# Patient Record
Sex: Male | Born: 1970
Health system: Southern US, Community
[De-identification: ages and names within clinical notes are randomized; demographics above are authoritative.]

## PROBLEM LIST (undated history)

## (undated) DIAGNOSIS — H9190 Unspecified hearing loss, unspecified ear: Secondary | ICD-10-CM

## (undated) DIAGNOSIS — E119 Type 2 diabetes mellitus without complications: Secondary | ICD-10-CM

## (undated) HISTORY — DX: Type 2 diabetes mellitus without complications: E11.9

## (undated) HISTORY — DX: Unspecified hearing loss, unspecified ear: H91.90

## (undated) HISTORY — PX: NO PAST SURGERIES: SHX2092

---

## 2018-12-24 ENCOUNTER — Emergency Department (HOSPITAL_COMMUNITY)
Admission: EM | Admit: 2018-12-24 | Discharge: 2018-12-24 | Disposition: A | Discharge status: Home / Self Care | Payer: No Typology Code available for payment source | Attending: Emergency Medicine | Admitting: Emergency Medicine

## 2018-12-24 ENCOUNTER — Emergency Department (HOSPITAL_COMMUNITY): Payer: No Typology Code available for payment source

## 2018-12-24 ENCOUNTER — Encounter (HOSPITAL_COMMUNITY): Payer: Self-pay

## 2018-12-24 DIAGNOSIS — R519 Headache, unspecified: Secondary | ICD-10-CM

## 2018-12-24 DIAGNOSIS — M545 Low back pain, unspecified: Secondary | ICD-10-CM

## 2018-12-24 DIAGNOSIS — Y999 Unspecified external cause status: Secondary | ICD-10-CM | POA: Diagnosis not present

## 2018-12-24 DIAGNOSIS — M25562 Pain in left knee: Secondary | ICD-10-CM | POA: Insufficient documentation

## 2018-12-24 DIAGNOSIS — R51 Headache: Secondary | ICD-10-CM | POA: Diagnosis not present

## 2018-12-24 DIAGNOSIS — S139XXA Sprain of joints and ligaments of unspecified parts of neck, initial encounter: Secondary | ICD-10-CM | POA: Insufficient documentation

## 2018-12-24 DIAGNOSIS — Y9389 Activity, other specified: Secondary | ICD-10-CM | POA: Diagnosis not present

## 2018-12-24 DIAGNOSIS — S199XXA Unspecified injury of neck, initial encounter: Secondary | ICD-10-CM | POA: Diagnosis present

## 2018-12-24 DIAGNOSIS — Y92410 Unspecified street and highway as the place of occurrence of the external cause: Secondary | ICD-10-CM | POA: Diagnosis not present

## 2018-12-24 DIAGNOSIS — M79661 Pain in right lower leg: Secondary | ICD-10-CM | POA: Insufficient documentation

## 2018-12-24 MED ORDER — HYDROCODONE-ACETAMINOPHEN 5-325 MG PO TABS
1.0000 | ORAL_TABLET | Freq: Once | ORAL | Status: AC
  Administered 2018-12-24: 1 via ORAL
  Filled 2018-12-24: qty 1

## 2018-12-24 MED ORDER — METHOCARBAMOL 500 MG PO TABS: 500.0000 mg | ORAL_TABLET | Freq: Two times a day (BID) | ORAL | 0 refills | Status: DC

## 2018-12-24 MED ORDER — NAPROXEN 500 MG PO TABS: 500.0000 mg | ORAL_TABLET | Freq: Two times a day (BID) | ORAL | 0 refills | Status: DC
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
## 2018-12-24 NOTE — ED Notes (Signed)
WALLE at bedside. Call bell at bedside.

## 2018-12-24 NOTE — ED Notes (Signed)
In ct/xray

## 2019-01-16 ENCOUNTER — Inpatient Hospital Stay (HOSPITAL_COMMUNITY)
Admission: EM | Admit: 2019-01-16 | Discharge: 2019-01-24 | DRG: 177 | Disposition: A | Discharge status: Home / Self Care | Payer: HRSA Program | Attending: Family Medicine | Admitting: Family Medicine

## 2019-01-16 ENCOUNTER — Encounter (HOSPITAL_COMMUNITY): Payer: Self-pay | Admitting: Emergency Medicine

## 2019-01-16 ENCOUNTER — Emergency Department (HOSPITAL_COMMUNITY): Payer: HRSA Program

## 2019-01-16 ENCOUNTER — Other Ambulatory Visit: Payer: Self-pay

## 2019-01-16 DIAGNOSIS — Z79899 Other long term (current) drug therapy: Secondary | ICD-10-CM | POA: Diagnosis not present

## 2019-01-16 DIAGNOSIS — J9601 Acute respiratory failure with hypoxia: Secondary | ICD-10-CM | POA: Diagnosis present

## 2019-01-16 DIAGNOSIS — J1289 Other viral pneumonia: Secondary | ICD-10-CM | POA: Diagnosis present

## 2019-01-16 DIAGNOSIS — U071 COVID-19: Principal | ICD-10-CM | POA: Diagnosis present

## 2019-01-16 DIAGNOSIS — J069 Acute upper respiratory infection, unspecified: Secondary | ICD-10-CM

## 2019-01-16 DIAGNOSIS — Z6838 Body mass index (BMI) 38.0-38.9, adult: Secondary | ICD-10-CM | POA: Diagnosis not present

## 2019-01-16 DIAGNOSIS — I517 Cardiomegaly: Secondary | ICD-10-CM | POA: Diagnosis present

## 2019-01-16 DIAGNOSIS — Z791 Long term (current) use of non-steroidal anti-inflammatories (NSAID): Secondary | ICD-10-CM | POA: Diagnosis not present

## 2019-01-16 DIAGNOSIS — I959 Hypotension, unspecified: Secondary | ICD-10-CM | POA: Diagnosis not present

## 2019-01-16 DIAGNOSIS — R739 Hyperglycemia, unspecified: Secondary | ICD-10-CM | POA: Diagnosis not present

## 2019-01-16 DIAGNOSIS — R0902 Hypoxemia: Secondary | ICD-10-CM

## 2019-01-16 DIAGNOSIS — E119 Type 2 diabetes mellitus without complications: Secondary | ICD-10-CM

## 2019-01-16 DIAGNOSIS — E1165 Type 2 diabetes mellitus with hyperglycemia: Secondary | ICD-10-CM | POA: Diagnosis present

## 2019-01-16 DIAGNOSIS — T50905A Adverse effect of unspecified drugs, medicaments and biological substances, initial encounter: Secondary | ICD-10-CM | POA: Diagnosis not present

## 2019-01-16 LAB — CBC WITH DIFFERENTIAL/PLATELET
Abs Immature Granulocytes: 0.04 10*3/uL (ref 0.00–0.07)
Basophils Absolute: 0 10*3/uL (ref 0.0–0.1)
Basophils Relative: 0 %
Eosinophils Absolute: 0 10*3/uL (ref 0.0–0.5)
Eosinophils Relative: 0 %
HCT: 44.3 % (ref 39.0–52.0)
Hemoglobin: 15.4 g/dL (ref 13.0–17.0)
Immature Granulocytes: 1 %
Lymphocytes Relative: 10 %
Lymphs Abs: 0.8 10*3/uL (ref 0.7–4.0)
MCH: 32.1 pg (ref 26.0–34.0)
MCHC: 34.8 g/dL (ref 30.0–36.0)
MCV: 92.3 fL (ref 80.0–100.0)
Monocytes Absolute: 0.3 10*3/uL (ref 0.1–1.0)
Monocytes Relative: 4 %
Neutro Abs: 7.1 10*3/uL (ref 1.7–7.7)
Neutrophils Relative %: 85 %
Platelets: 406 10*3/uL — ABNORMAL HIGH (ref 150–400)
RBC: 4.8 MIL/uL (ref 4.22–5.81)
RDW: 11.8 % (ref 11.5–15.5)
WBC: 8.2 10*3/uL (ref 4.0–10.5)
nRBC: 0 % (ref 0.0–0.2)

## 2019-01-16 LAB — FERRITIN: Ferritin: 408 ng/mL — ABNORMAL HIGH (ref 24–336)

## 2019-01-16 LAB — COMPREHENSIVE METABOLIC PANEL
ALT: 43 U/L (ref 0–44)
AST: 32 U/L (ref 15–41)
Albumin: 3.2 g/dL — ABNORMAL LOW (ref 3.5–5.0)
Alkaline Phosphatase: 82 U/L (ref 38–126)
Anion gap: 13 (ref 5–15)
BUN: 10 mg/dL (ref 6–20)
CO2: 23 mmol/L (ref 22–32)
Calcium: 8.8 mg/dL — ABNORMAL LOW (ref 8.9–10.3)
Chloride: 102 mmol/L (ref 98–111)
Creatinine, Ser: 0.69 mg/dL (ref 0.61–1.24)
GFR calc Af Amer: >60 mL/min (ref >60)
GFR calc non Af Amer: >60 mL/min (ref >60)
Glucose, Bld: 175 mg/dL — ABNORMAL HIGH (ref 70–99)
Potassium: 3.6 mmol/L (ref 3.5–5.1)
Sodium: 138 mmol/L (ref 135–145)
Total Bilirubin: 0.7 mg/dL (ref 0.3–1.2)
Total Protein: 7.4 g/dL (ref 6.5–8.1)

## 2019-01-16 LAB — POCT I-STAT EG7
Bicarbonate: 24.7 mmol/L (ref 20.0–28.0)
Calcium, Ion: 1.04 mmol/L — ABNORMAL LOW (ref 1.15–1.40)
HCT: 45 % (ref 39.0–52.0)
Hemoglobin: 15.3 g/dL (ref 13.0–17.0)
O2 Saturation: 93 %
Potassium: 3.6 mmol/L (ref 3.5–5.1)
Sodium: 138 mmol/L (ref 135–145)
TCO2: 26 mmol/L (ref 22–32)
pCO2, Ven: 39.3 mmHg — ABNORMAL LOW (ref 44.0–60.0)
pH, Ven: 7.406 (ref 7.250–7.430)
pO2, Ven: 66 mmHg — ABNORMAL HIGH (ref 32.0–45.0)

## 2019-01-16 LAB — D-DIMER, QUANTITATIVE: D-Dimer, Quant: 0.42 ug/mL-FEU (ref 0.00–0.50)

## 2019-01-16 LAB — LACTATE DEHYDROGENASE: LDH: 287 U/L — ABNORMAL HIGH (ref 98–192)

## 2019-01-16 LAB — TRIGLYCERIDES: Triglycerides: 122 mg/dL (ref <150)

## 2019-01-16 LAB — BRAIN NATRIURETIC PEPTIDE: B Natriuretic Peptide: 20.6 pg/mL (ref 0.0–100.0)

## 2019-01-16 LAB — FIBRINOGEN: Fibrinogen: >800 mg/dL — ABNORMAL HIGH (ref 210–475)

## 2019-01-16 LAB — MRSA PCR SCREENING: MRSA by PCR: NEGATIVE

## 2019-01-16 LAB — SARS CORONAVIRUS 2 BY RT PCR (HOSPITAL ORDER, PERFORMED IN ~~LOC~~ HOSPITAL LAB): SARS Coronavirus 2: POSITIVE — AB

## 2019-01-16 LAB — LACTIC ACID, PLASMA: Lactic Acid, Venous: 1.8 mmol/L (ref 0.5–1.9)

## 2019-01-16 LAB — C-REACTIVE PROTEIN: CRP: 16.8 mg/dL — ABNORMAL HIGH (ref <1.0)

## 2019-01-16 LAB — PROCALCITONIN: Procalcitonin: <0.1 ng/mL

## 2019-01-16 MED ORDER — SODIUM CHLORIDE 0.9 % IV SOLN
200.0000 mg | Freq: Once | INTRAVENOUS | Status: AC
  Administered 2019-01-16: 200 mg via INTRAVENOUS
  Filled 2019-01-16: qty 40

## 2019-01-16 MED ORDER — SODIUM CHLORIDE 0.9 % IV SOLN
1.0000 g | Freq: Once | INTRAVENOUS | Status: AC
  Administered 2019-01-16: 1 g via INTRAVENOUS
  Filled 2019-01-16: qty 10

## 2019-01-16 MED ORDER — CHLORHEXIDINE GLUCONATE CLOTH 2 % EX PADS
6.0000 | MEDICATED_PAD | Freq: Every day | CUTANEOUS | Status: DC
  Administered 2019-01-17 – 2019-01-24 (×8): 6 via TOPICAL

## 2019-01-16 MED ORDER — ENOXAPARIN SODIUM 40 MG/0.4ML ~~LOC~~ SOLN: 40.0000 mg | SUBCUTANEOUS | Status: DC

## 2019-01-16 MED ORDER — ONDANSETRON HCL 4 MG/2ML IJ SOLN: 4.0000 mg | Freq: Four times a day (QID) | INTRAMUSCULAR | Status: DC | PRN

## 2019-01-16 MED ORDER — CHLORHEXIDINE GLUCONATE CLOTH 2 % EX PADS: 6.0000 | MEDICATED_PAD | Freq: Every day | CUTANEOUS | Status: DC

## 2019-01-16 MED ORDER — ENOXAPARIN SODIUM 60 MG/0.6ML ~~LOC~~ SOLN
0.5000 mg/kg | Freq: Two times a day (BID) | SUBCUTANEOUS | Status: DC
  Administered 2019-01-17 – 2019-01-22 (×11): 60 mg via SUBCUTANEOUS
  Filled 2019-01-16 (×11): qty 0.6

## 2019-01-16 MED ORDER — SODIUM CHLORIDE 0.9 % IV SOLN
100.0000 mg | INTRAVENOUS | Status: AC
  Administered 2019-01-17 – 2019-01-20 (×4): 100 mg via INTRAVENOUS
  Filled 2019-01-16 (×4): qty 20

## 2019-01-16 MED ORDER — METHYLPREDNISOLONE SODIUM SUCC 125 MG IJ SOLR
60.0000 mg | Freq: Four times a day (QID) | INTRAMUSCULAR | Status: DC
  Administered 2019-01-16 – 2019-01-17 (×4): 60 mg via INTRAVENOUS
  Filled 2019-01-16 (×4): qty 2

## 2019-01-16 MED ORDER — TOCILIZUMAB 400 MG/20ML IV SOLN
800.0000 mg | Freq: Once | INTRAVENOUS | Status: AC
  Administered 2019-01-16: 800 mg via INTRAVENOUS
  Filled 2019-01-16: qty 40

## 2019-01-16 MED ORDER — ACETAMINOPHEN 325 MG PO TABS: 650.0000 mg | ORAL_TABLET | Freq: Four times a day (QID) | ORAL | Status: DC | PRN

## 2019-01-16 MED ORDER — POLYETHYLENE GLYCOL 3350 17 G PO PACK: 17.0000 g | PACK | Freq: Every day | ORAL | Status: DC | PRN

## 2019-01-16 MED ORDER — AZITHROMYCIN 250 MG PO TABS
500.0000 mg | ORAL_TABLET | Freq: Once | ORAL | Status: AC
  Administered 2019-01-16: 500 mg via ORAL
  Filled 2019-01-16: qty 2

## 2019-01-16 MED ORDER — ONDANSETRON HCL 4 MG PO TABS: 4.0000 mg | ORAL_TABLET | Freq: Four times a day (QID) | ORAL | Status: DC | PRN

## 2019-01-16 NOTE — ED Provider Notes (Signed)
[facility] Provider Note   CSN: [id number] Arrival date & time: 01/16/19  1046    History   Chief Complaint Chief Complaint  Patient presents with  . Shortness of Breath    HPI Terry Cole is a 48 y.o. male.     48 year old male who presents with shortness of breath.  He reports 5 days of dry cough that has been improving but he has had progressively worsening shortness of breath.  He reports sore throat.  No fevers, body aches, vomiting, diarrhea, sick contacts, or recent travel.  No leg swelling.  He has tried cough medication for his symptoms.  The history is provided by the patient. A language interpreter was used.    History reviewed. No pertinent past medical history.  There are no active problems to display for this patient.   History reviewed. No pertinent surgical history.      Home Medications    Prior to Admission medications   Medication Sig Start Date End Date Taking? Authorizing Provider  methocarbamol (ROBAXIN) 500 MG tablet Take 1 tablet (500 mg total) by mouth 2 (two) times daily. 12/24/18   Henderly, Britni A, PA-C  naproxen (NAPROSYN) 500 MG tablet Take 1 tablet (500 mg total) by mouth 2 (two) times daily. 12/24/18   Henderly, Britni A, PA-C    Family History No family history on file.  Social History Social History   Tobacco Use  . Smoking status: Never Smoker  . Smokeless tobacco: Never Used  Substance Use Topics  . Alcohol use: Yes    Alcohol/week: 1.0 standard drinks    Types: 1 Cans of beer per week  . Drug use: Not on file     Allergies   Patient has no allergy information on record.   Review of Systems Review of Systems All other systems reviewed and are negative except that which was mentioned in HPI   Physical Exam Updated Vital Signs BP 117/79   Pulse 97   Temp 98.2 F (36.8 C) (Oral)   Resp (!) 35   SpO2 94%   Physical Exam Vitals signs and nursing note reviewed.   Constitutional:      Appearance: He is well-developed. He is ill-appearing and diaphoretic. He is not toxic-appearing.  HENT:     Head: Normocephalic and atraumatic.  Eyes:     Conjunctiva/sclera: Conjunctivae normal.  Neck:     Musculoskeletal: Neck supple.  Cardiovascular:     Rate and Rhythm: Regular rhythm. Tachycardia present.  Pulmonary:     Effort: Tachypnea and accessory muscle usage present.     Comments: Increased work of breathing with tachypnea, no severe respiratory distress Abdominal:     General: Bowel sounds are normal. There is no distension.     Palpations: Abdomen is soft.     Tenderness: There is no abdominal tenderness.  Musculoskeletal:     Right lower leg: No edema.     Left lower leg: No edema.  Skin:    General: Skin is warm.  Neurological:     Mental Status: He is alert and oriented to person, place, and time.     Comments: Fluent speech  Psychiatric:        Mood and Affect: Mood is anxious.        Judgment: Judgment normal.      ED Treatments / Results  Labs (all labs ordered are listed, but only abnormal results are displayed) Labs Reviewed  SARS CORONAVIRUS 2 [facility](HOSPITAL ORDER,  PERFORMED IN [place] [facility]) - Abnormal; Notable for the following components:      Result Value   SARS Coronavirus 2 POSITIVE (*)    All other components within normal limits  CBC WITH DIFFERENTIAL/PLATELET - Abnormal; Notable for the following components:   Platelets 406 (*)    All other components within normal limits  COMPREHENSIVE METABOLIC PANEL - Abnormal; Notable for the following components:   Glucose, Bld 175 (*)    Calcium 8.8 (*)    Albumin 3.2 (*)    All other components within normal limits  LACTATE DEHYDROGENASE - Abnormal; Notable for the following components:   LDH 287 (*)    All other components within normal limits  FERRITIN - Abnormal; Notable for the following components:   Ferritin 408 (*)    All other components within normal  limits  FIBRINOGEN - Abnormal; Notable for the following components:   Fibrinogen >800 (*)    All other components within normal limits  C-REACTIVE PROTEIN - Abnormal; Notable for the following components:   CRP 16.8 (*)    All other components within normal limits  POCT I-STAT EG7 - Abnormal; Notable for the following components:   pCO2, Ven 39.3 (*)    pO2, Ven 66.0 (*)    Calcium, Ion 1.04 (*)    All other components within normal limits  CULTURE, BLOOD (ROUTINE X 2)  CULTURE, BLOOD (ROUTINE X 2)  LACTIC ACID, PLASMA  D-DIMER, QUANTITATIVE (NOT AT [facility])  PROCALCITONIN  TRIGLYCERIDES  BRAIN NATRIURETIC PEPTIDE  LACTIC ACID, PLASMA  I-STAT VENOUS BLOOD GAS, ED    EKG EKG Interpretation  Date/Time:  Wednesday January 16 2019 10:59:52 EDT Ventricular Rate:  95 PR Interval:    QRS Duration: 106 QT Interval:  366 QTC Calculation: 461 R Axis:   59 Text Interpretation:  Age not entered, assumed to be  48 years old for purpose of ECG interpretation Sinus rhythm Low voltage, precordial leads RSR' in V1 or V2, right VCD or RVH No previous ECGs available Confirmed by Frederick PeersLittle,  [phone]([id number]) on 01/16/2019 11:10:43 AM   Radiology Dg Chest Port 1 View  Result Date: 01/16/2019 CLINICAL DATA:  Shortness of breath. EXAM: PORTABLE CHEST 1 VIEW COMPARISON:  None. FINDINGS: Mild cardiomegaly is noted. Hypoinflation of the lungs is noted. Large right lower lobe opacity is noted most consistent with pneumonia. Mild left basilar atelectasis or infiltrate is noted. No pneumothorax is noted. Small pleural effusions cannot be excluded. Bony thorax is unremarkable. IMPRESSION: Hypoinflation of the lungs. Large right lower lobe opacity is noted most consistent with pneumonia. Mild left basilar atelectasis or infiltrate. Electronically Signed   By: Lupita RaiderJames  Green Jr M.D.   On: 01/16/2019 12:12    Procedures .Critical Care Performed by: Laurence SpatesLittle,  Morgan, MD Authorized by: Laurence SpatesLittle,  Morgan, MD    Critical care provider statement:    Critical care time (minutes):  30   Critical care time was exclusive of:  Separately billable procedures and treating other patients   Critical care was necessary to treat or prevent imminent or life-threatening deterioration of the following conditions:  Respiratory failure   Critical care was time spent personally by me on the following activities:  Development of treatment plan with patient or surrogate, evaluation of patient's response to treatment, examination of patient, obtaining history from patient or surrogate, ordering and performing treatments and interventions, ordering and review of laboratory studies, ordering and review of radiographic studies and review of old charts   (  including critical care time)  Medications Ordered in ED Medications  cefTRIAXone (ROCEPHIN) 1 g in sodium chloride 0.9 % 100 mL IVPB (1 g Intravenous New Bag/Given 01/16/19 1257)  azithromycin (ZITHROMAX) tablet 500 mg (500 mg Oral Given 01/16/19 1251)     Initial Impression / Assessment and Plan / ED Course  I have reviewed the triage vital signs and the nursing notes.  Pertinent labs & imaging results that were available during my care of the patient were reviewed by me and considered in my medical decision making (see chart for details).        Ill-appearing with tachypnea on exam but no severe respiratory distress.  In the 80s on room air, improved to mid 90s on 4L [place]. DDx includes COVID-19, PNA, CHF, PE. Concern for COVID-19 based on progression of sx.   Chest x-ray showed right lower lobe pneumonia.  Gave a dose of ceftriaxone and azithromycin, blood culture sent.  Lab work shows reassuring VBG, normal CBC and CMP.  Elevation of LDH, ferritin, fibrinogen, and CRP.  COVID-19 test later came back positive, I suspect this is the cause of his symptoms.  He has continued to have 4 L oxygen requirement therefore will admit. Discussed admission with [facility], Dr. Jamse Arn.    Terry Cole was evaluated in Emergency Department on 01/16/2019 for the symptoms described in the history of present illness. He was evaluated in the context of the global COVID-19 pandemic, which necessitated consideration that the patient might be at risk for infection with the SARS-CoV-2 virus that causes COVID-19. Institutional protocols and algorithms that pertain to the evaluation of patients at risk for COVID-19 are in a state of rapid change based on information released by regulatory bodies including the CDC and federal and state organizations. These policies and algorithms were followed during the patient's care in the ED.  Final Clinical Impressions(s) / ED Diagnoses   Final diagnoses:  Acute respiratory disease due to COVID-19 virus    ED Discharge Orders    None       , Wenda Overland, MD 01/16/19 1458

## 2019-01-16 NOTE — ED Triage Notes (Signed)
Pt arrives to ED with family pt is spanish speaking -reports sob with sore thoart. Pt has low sats in triage 84% on room air

## 2019-01-16 NOTE — H&P (Signed)
History and Physical:    Mariea ClontsHermelindo Heater   UJW:[id number] DOB: 12/11/1970 DOA: 01/16/2019  Referring MD/provider:  PCP: Patient, No Pcp Per   Patient coming from: Home  Chief Complaint: Shortness of breath.  History as per patient as well as ED documentation.  Translator was used for history.  History of Present Illness:   Terry Cole is an 48 y.o. male with no significant past medical history who does not smoke who was apparently in his usual state of health until 2 to 3 days ago when he developed fever cough and shortness of breath.  Patient notes the shortness of breath is gotten worse to the point where he is now short of breath just sitting.  Patient notes cough is productive of scant clear phlegm.  No hemoptysis.   Of note history is somewhat inconsistent.  It is unclear whether patient is confused or whether he is not understanding the language despite use of a Nurse, learning disabilitytranslator.  Patient states that he became short of breath 3 to 4 days ago however on specific questioning he states that he was indeed short of breath on Wednesday.  However when asked again he states he felt fine on Wednesday.  It is unclear when his disease course actually started.  Upon direct questioning, patient admitted to feeling confused and then changed his mind and said no he was not confused.  Review of systems is notable for fevers and malaise.  He states he just does not feel well.  No rash.  Patient does admit to some diarrhea for a day or 2.  No abdominal pain.  No nausea or vomiting.  Patient denies any chest pain with exertion or at rest.  States he has been eating and drinking okay.  Denies alcohol use.  Denies any tobacco or any other inhaled substances.  Patient lives with a male roommate who apparently is well and has no symptoms.  Patient denies any known sick contacts.  ED Course: Upon presentation to the patient was noted to be tachypneic and hypoxic with initial O2 sats at 84% on room air.  He  was treated with oxygen 4 L with improvement of O2 saturations to low 90s.  When I went to see him patient was tachypneic, diaphoretic and oxygen saturations mid 80s despite having had his oxygen turned up to 6 L.  Patient was placed on a 15 L nonrebreather oxygen mask and sat up to 90 degrees and O2 saturations improved initially to the low 90s and then to 99%.  Patient became much more comfortable, diaphoresis resolved.  Steroids, Remdesivir and Actrema were ordered.  ROS:   ROS   Review of Systems: Skin: No rashes, lesions, wounds Eyes: no discharge, redness, pain HENT: no ear pain, hearing loss, drainage, tinnitus Endocrine: no heat/cold intolerance, no polyuria Cardiovascular: No palpitations, chest pain GU: No dysuria, increased frequency CNS: No numbness, dizziness, headache Musculoskeletal: No back pain, joint pain Blood/lymphatics: No easy bruising, bleeding Mood/affect: No anxiety/depression    Past Medical History:   History reviewed. No pertinent past medical history.  Past Surgical History:   History reviewed. No pertinent surgical history.  Social History:   Social History   Socioeconomic History   Marital status: Single    Spouse name: Not on file   Number of children: Not on file   Years of education: Not on file   Highest education level: Not on file  Occupational History   Not on file  Social Needs   Financial  resource strain: Not on file   Food insecurity    Worry: Not on file    Inability: Not on file   Transportation needs    Medical: Not on file    Non-medical: Not on file  Tobacco Use   Smoking status: Never Smoker   Smokeless tobacco: Never Used  Substance and Sexual Activity   Alcohol use: Yes    Alcohol/week: 1.0 standard drinks    Types: 1 Cans of beer per week   Drug use: Not on file   Sexual activity: Not on file  Lifestyle   Physical activity    Days per week: Not on file    Minutes per session: Not on file    Stress: Not on file  Relationships   Social connections    Talks on phone: Not on file    Gets together: Not on file    Attends religious service: Not on file    Active member of club or organization: Not on file    Attends meetings of clubs or organizations: Not on file    Relationship status: Not on file   Intimate partner violence    Fear of current or ex partner: Not on file    Emotionally abused: Not on file    Physically abused: Not on file    Forced sexual activity: Not on file  Other Topics Concern   Not on file  Social History Narrative   Not on file    Allergies   Patient has no allergy information on record.  Family history:   No family history on file.  Current Medications:   Prior to Admission medications   Medication Sig Start Date End Date Taking? Authorizing Provider  methocarbamol (ROBAXIN) 500 MG tablet Take 1 tablet (500 mg total) by mouth 2 (two) times daily. 12/24/18   Henderly, Britni A, PA-C  naproxen (NAPROSYN) 500 MG tablet Take 1 tablet (500 mg total) by mouth 2 (two) times daily. 12/24/18   Henderly, Britni A, PA-C    Physical Exam:   Vitals:   01/16/19 1145 01/16/19 1200 01/16/19 1545 01/16/19 1600  BP: 125/84 117/79 (!) 140/91   Pulse: 92 97 87   Resp: (!) 34 (!) 35 (!) 30   Temp:      TempSrc:      SpO2: 96% 94% 92%   Weight:    117.9 kg  Height:    5\' 10"  (1.778 m)     Physical Exam: Blood pressure (!) 140/91, pulse 87, temperature 98.2 F (36.8 C), temperature source Oral, resp. rate (!) 30, height 5\' 10"  (1.778 m), weight 117.9 kg, SpO2 92 %. Gen: Diaphoretic obese man with barrel chest lying in stretcher at 20 degrees with tachypnea and increased work of breathing but not an extremis, mild accessory muscle use. Eyes: Sclerae anicteric. Conjunctiva mildly injected. Chest: Decreased air entry bilaterally possibly secondary to largeness of chest wall.  I am unable to hear any adventitious sounds.  No wheezes or rales that I can  appreciate.   CV: Distant, regular, no audible murmurs. Abdomen: Obese, NABS, soft, nondistended, nontender. No tenderness to light or deep palpation. No rebound, no guarding. Extremities: No edema.  Skin: Warm and dry. No rashes, lesions or wounds. Neuro: Alert and oriented times 3; grossly nonfocal.  Initial confusion during history seems to have improved with treatment of hypoxia.  Data Review:    Labs: Basic Metabolic Panel: Recent Labs  Lab 01/16/19 1150 01/16/19 1206  NA 138 138  K 3.6 3.6  CL 102  --   CO2 23  --   GLUCOSE 175*  --   BUN 10  --   CREATININE 0.69  --   CALCIUM 8.8*  --    Liver Function Tests: Recent Labs  Lab 01/16/19 1150  AST 32  ALT 43  ALKPHOS 82  BILITOT 0.7  PROT 7.4  ALBUMIN 3.2*   No results for input(s): LIPASE, AMYLASE in the last 168 hours. No results for input(s): AMMONIA in the last 168 hours. CBC: Recent Labs  Lab 01/16/19 1150 01/16/19 1206  WBC 8.2  --   NEUTROABS 7.1  --   HGB 15.4 15.3  HCT 44.3 45.0  MCV 92.3  --   PLT 406*  --    Cardiac Enzymes: No results for input(s): CKTOTAL, CKMB, CKMBINDEX, TROPONINI in the last 168 hours.  BNP (last 3 results) No results for input(s): PROBNP in the last 8760 hours. CBG: No results for input(s): GLUCAP in the last 168 hours.  Urinalysis No results found for: COLORURINE, APPEARANCEUR, LABSPEC, PHURINE, GLUCOSEU, HGBUR, BILIRUBINUR, KETONESUR, PROTEINUR, UROBILINOGEN, NITRITE, LEUKOCYTESUR    Radiographic Studies: Dg Chest Port 1 View  Result Date: 01/16/2019 CLINICAL DATA:  Shortness of breath. EXAM: PORTABLE CHEST 1 VIEW COMPARISON:  None. FINDINGS: Mild cardiomegaly is noted. Hypoinflation of the lungs is noted. Large right lower lobe opacity is noted most consistent with pneumonia. Mild left basilar atelectasis or infiltrate is noted. No pneumothorax is noted. Small pleural effusions cannot be excluded. Bony thorax is unremarkable. IMPRESSION: Hypoinflation of the  lungs. Large right lower lobe opacity is noted most consistent with pneumonia. Mild left basilar atelectasis or infiltrate. Electronically Signed   By: Marijo Conception M.D.   On: 01/16/2019 12:12    EKG: Independently reviewed.  Sinus rhythm at 95.  Normal intervals.  Normal axis.  Possible right bundleoid pattern developing.  Insignificant Q in 3.  No acute ST-T wave changes.   Assessment/Plan:   Principal Problem:   Pneumonia due to COVID-19 virus Active Problems:   Hypoxia  48 year old man with no other medical history who apparently does not smoke now presents with COVID pneumonia.   COVID PNEUMONIA Initial hypoxia has responded well to nonrebreather 15 L and sitting up.  Solu-Medrol 60 every 6, Remdesivir ordered. Given marked increase in oxygen requirement over 2 hours, Actrema was also requested Patient to be admitted to the intensive care unit at [place]  Possibly undiagnosed hypoparathyroidism versus vitamin D deficiency. Repeat in the morning and check PTH if still low.   Other information:   DVT prophylaxis: Lovenox ordered. Code Status: Full code. Family Communication: Patient states he has no family to call Disposition Plan: Home Consults called: None Admission status: Inpatient  The medical decision making on this patient was of high complexity and the patient is at high risk for clinical deterioration, therefore this is a level 3 visit.   Dewaine Oats Tublu Janzen Sacks [facility]  If 7PM-7AM, please contact night-coverage [email or web address] Password [facility] 01/16/2019, 5:23 PM
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
## 2019-01-16 NOTE — ED Notes (Signed)
Pt SpO2 dropped to 80% on [place] 4 LPM. Pt placed on NRB 15LPM. Pt SpO2 up to 94%.

## 2019-01-16 NOTE — Progress Notes (Signed)
Pharmacy Brief Note  O:  ALT: 43 CXR: consistent with PNA SpO2: 96 % on NRB  A/P:  Patient meets criteria for remdesevir therapy. Will initiate remdesivir 200 mg iv once followed by 100 mg iv daily x 4 days. Will f/u ALT.   Terry Cole, [facility] 01/16/19 5:59 PM

## 2019-01-16 NOTE — Plan of Care (Signed)
  Problem: Education: Goal: Knowledge of General Education information will improve Description Including pain rating scale, medication(s)/side effects and non-pharmacologic comfort measures Outcome: Progressing   

## 2019-01-17 DIAGNOSIS — T50905A Adverse effect of unspecified drugs, medicaments and biological substances, initial encounter: Secondary | ICD-10-CM

## 2019-01-17 DIAGNOSIS — J9601 Acute respiratory failure with hypoxia: Secondary | ICD-10-CM

## 2019-01-17 DIAGNOSIS — R739 Hyperglycemia, unspecified: Secondary | ICD-10-CM

## 2019-01-17 LAB — CBC WITH DIFFERENTIAL/PLATELET
Abs Immature Granulocytes: 0.01 10*3/uL (ref 0.00–0.07)
Basophils Absolute: 0 10*3/uL (ref 0.0–0.1)
Basophils Relative: 1 %
Eosinophils Absolute: 0 10*3/uL (ref 0.0–0.5)
Eosinophils Relative: 0 %
HCT: 46.8 % (ref 39.0–52.0)
Hemoglobin: 15.7 g/dL (ref 13.0–17.0)
Immature Granulocytes: 0 %
Lymphocytes Relative: 18 %
Lymphs Abs: 0.6 10*3/uL — ABNORMAL LOW (ref 0.7–4.0)
MCH: 31.3 pg (ref 26.0–34.0)
MCHC: 33.5 g/dL (ref 30.0–36.0)
MCV: 93.4 fL (ref 80.0–100.0)
Monocytes Absolute: 0.1 10*3/uL (ref 0.1–1.0)
Monocytes Relative: 2 %
Neutro Abs: 2.6 10*3/uL (ref 1.7–7.7)
Neutrophils Relative %: 79 %
Platelets: 359 10*3/uL (ref 150–400)
RBC: 5.01 MIL/uL (ref 4.22–5.81)
RDW: 12 % (ref 11.5–15.5)
WBC: 3.3 10*3/uL — ABNORMAL LOW (ref 4.0–10.5)
nRBC: 0 % (ref 0.0–0.2)

## 2019-01-17 LAB — MAGNESIUM: Magnesium: 2.3 mg/dL (ref 1.7–2.4)

## 2019-01-17 LAB — COMPREHENSIVE METABOLIC PANEL
ALT: 53 U/L — ABNORMAL HIGH (ref 0–44)
AST: 38 U/L (ref 15–41)
Albumin: 3.3 g/dL — ABNORMAL LOW (ref 3.5–5.0)
Alkaline Phosphatase: 82 U/L (ref 38–126)
Anion gap: 14 (ref 5–15)
BUN: 21 mg/dL — ABNORMAL HIGH (ref 6–20)
CO2: 22 mmol/L (ref 22–32)
Calcium: 9 mg/dL (ref 8.9–10.3)
Chloride: 103 mmol/L (ref 98–111)
Creatinine, Ser: 0.53 mg/dL — ABNORMAL LOW (ref 0.61–1.24)
GFR calc Af Amer: >60 mL/min (ref >60)
GFR calc non Af Amer: >60 mL/min (ref >60)
Glucose, Bld: 247 mg/dL — ABNORMAL HIGH (ref 70–99)
Potassium: 4.1 mmol/L (ref 3.5–5.1)
Sodium: 139 mmol/L (ref 135–145)
Total Bilirubin: 0.3 mg/dL (ref 0.3–1.2)
Total Protein: 7.5 g/dL (ref 6.5–8.1)

## 2019-01-17 LAB — HIV ANTIBODY (ROUTINE TESTING W REFLEX): HIV Screen 4th Generation wRfx: NONREACTIVE

## 2019-01-17 LAB — GLUCOSE, CAPILLARY
Glucose-Capillary: 285 mg/dL — ABNORMAL HIGH (ref 70–99)
Glucose-Capillary: 272 mg/dL — ABNORMAL HIGH (ref 70–99)
Glucose-Capillary: 339 mg/dL — ABNORMAL HIGH (ref 70–99)

## 2019-01-17 LAB — FERRITIN: Ferritin: 484 ng/mL — ABNORMAL HIGH (ref 24–336)

## 2019-01-17 LAB — D-DIMER, QUANTITATIVE: D-Dimer, Quant: 0.51 ug/mL-FEU — ABNORMAL HIGH (ref 0.00–0.50)

## 2019-01-17 LAB — ABO/RH: ABO/RH(D): O POS

## 2019-01-17 LAB — C-REACTIVE PROTEIN: CRP: 16.5 mg/dL — ABNORMAL HIGH (ref <1.0)

## 2019-01-17 MED ORDER — METHYLPREDNISOLONE SODIUM SUCC 125 MG IJ SOLR
60.0000 mg | Freq: Two times a day (BID) | INTRAMUSCULAR | Status: DC
  Administered 2019-01-17 – 2019-01-22 (×11): 60 mg via INTRAVENOUS
  Filled 2019-01-17 (×12): qty 2

## 2019-01-17 MED ORDER — TOCILIZUMAB 400 MG/20ML IV SOLN
800.0000 mg | Freq: Once | INTRAVENOUS | Status: AC
  Administered 2019-01-17: 800 mg via INTRAVENOUS
  Filled 2019-01-17: qty 40

## 2019-01-17 MED ORDER — VITAMIN C 500 MG PO TABS
500.0000 mg | ORAL_TABLET | Freq: Every day | ORAL | Status: DC
  Administered 2019-01-17 – 2019-01-24 (×8): 500 mg via ORAL
  Filled 2019-01-17 (×8): qty 1

## 2019-01-17 MED ORDER — FAMOTIDINE 20 MG PO TABS
20.0000 mg | ORAL_TABLET | Freq: Two times a day (BID) | ORAL | Status: DC
  Administered 2019-01-17 – 2019-01-24 (×15): 20 mg via ORAL
  Filled 2019-01-17 (×15): qty 1

## 2019-01-17 MED ORDER — INSULIN ASPART 100 UNIT/ML ~~LOC~~ SOLN
0.0000 [IU] | SUBCUTANEOUS | Status: DC
  Administered 2019-01-17: 15 [IU] via SUBCUTANEOUS
  Administered 2019-01-17 (×2): 11 [IU] via SUBCUTANEOUS
  Administered 2019-01-18: 7 [IU] via SUBCUTANEOUS
  Administered 2019-01-18: 11 [IU] via SUBCUTANEOUS
  Administered 2019-01-18: 20 [IU] via SUBCUTANEOUS
  Administered 2019-01-18: 7 [IU] via SUBCUTANEOUS
  Administered 2019-01-18: 4 [IU] via SUBCUTANEOUS
  Administered 2019-01-18: 15 [IU] via SUBCUTANEOUS
  Administered 2019-01-19 (×2): 11 [IU] via SUBCUTANEOUS
  Administered 2019-01-19: 20 [IU] via SUBCUTANEOUS
  Administered 2019-01-20: 15 [IU] via SUBCUTANEOUS
  Administered 2019-01-20: 7 [IU] via SUBCUTANEOUS
  Administered 2019-01-20: 15 [IU] via SUBCUTANEOUS
  Administered 2019-01-20: 4 [IU] via SUBCUTANEOUS
  Administered 2019-01-20: 7 [IU] via SUBCUTANEOUS
  Administered 2019-01-20: 15 [IU] via SUBCUTANEOUS
  Administered 2019-01-21 (×2): 20 [IU] via SUBCUTANEOUS
  Administered 2019-01-21: 3 [IU] via SUBCUTANEOUS
  Administered 2019-01-21: 4 [IU] via SUBCUTANEOUS
  Administered 2019-01-21: 15 [IU] via SUBCUTANEOUS
  Administered 2019-01-21: 4 [IU] via SUBCUTANEOUS
  Administered 2019-01-22: 15 [IU] via SUBCUTANEOUS
  Administered 2019-01-22: 20 [IU] via SUBCUTANEOUS
  Administered 2019-01-22: 3 [IU] via SUBCUTANEOUS
  Administered 2019-01-22: 4 [IU] via SUBCUTANEOUS
  Administered 2019-01-22: 20 [IU] via SUBCUTANEOUS
  Administered 2019-01-22: 7 [IU] via SUBCUTANEOUS
  Administered 2019-01-23: 11 [IU] via SUBCUTANEOUS
  Administered 2019-01-23: 4 [IU] via SUBCUTANEOUS
  Administered 2019-01-23: 15 [IU] via SUBCUTANEOUS

## 2019-01-17 MED ORDER — ZINC SULFATE 220 (50 ZN) MG PO CAPS
220.0000 mg | ORAL_CAPSULE | Freq: Every day | ORAL | Status: DC
  Administered 2019-01-17 – 2019-01-24 (×8): 220 mg via ORAL
  Filled 2019-01-17 (×7): qty 1

## 2019-01-17 NOTE — Progress Notes (Signed)
Patient will be transferring to [place] and gave report on pateint. Patient will be going to room 125

## 2019-01-17 NOTE — Progress Notes (Signed)
Spoke to patient's nephew Roman and gave him updates on patient condition and plan of care. Answered his questions regarding patient and care given. He was satisfied with update and will call if he has further questions.

## 2019-01-17 NOTE — Progress Notes (Signed)
PROGRESS NOTE  Terry Cole ZOX:[id number] DOB: 11/13/1970 DOA: 01/16/2019  PCP: Patient, No Pcp Per  Brief History/Interval Summary: 48 year old Hispanic male with no significant past medical history who does not smoke was well until about 3 to 4 days prior to hospitalization when he developed fever cough shortness of breath.  His symptoms continue to get worse.  He presented to the emergency department.  He was found to be hypoxic saturating in the early 80s.  He was placed initially on nasal cannula and then had to be changed to nonrebreather.  He was admitted for further management.   Reason for Visit: Acute respiratory failure with hypoxia due to COVID-19  Consultants: Pulmonology  Procedures: None yet  Antibiotics: None  Subjective/Interval History: Dr. Kendrick FriesMcQuaid was able to interpret.  Patient mentioned that he was feeling slightly better.  He was able to lie in prone position overnight.  Still has a cough.  Denies any chest pain.  No nausea vomiting.    Assessment/Plan:  Acute Hypoxic Resp. Failure due to Acute Covid 19 Viral Illness   COVID-19 Labs  Recent Labs    01/16/19 1150 01/17/19 0606  DDIMER 0.42 0.51*  FERRITIN 408* 484*  LDH 287*  --   CRP 16.8* 16.5*    Lab Results  Component Value Date   SARSCOV2NAA POSITIVE (A) 01/16/2019     Fever: Has been afebrile Oxygen requirements: Currently on 15 L of oxygen by high flow nasal cannula.  Saturating in the early 90s. Antibiotics: No antibacterials Remdesivir: Day 2 Steroids: On Solu-Medrol 60 mg every 6 hours.  This will be changed to twice a day. Diuretics: None yet Actemra: Was given a dose of Actemra yesterday.  Second dose to be given today. Convalescent Plasma: None yet Vitamin C and Zinc: Will be initiated DVT Prophylaxis:  Lovenox 60 mg every 12 hours  Patient's respiratory status remains table.  He is still requiring high flow nasal cannula at 12 to 15 L.  Saturating in the late 80s.  He is  on Remdesivir and steroids.  Steroid dose will be reduced.  His CRP remains elevated at 16.5.  Ferritin 484.  Procalcitonin was less than 0.1.  Continue to hold off on antibacterials.  D-dimer 0.51.  Give additional dose of Actemra today.  Continue to wean down oxygen as tolerated.  Prone positioning, incentive spirometry and mobilization.  Consider convalescent plasma if there is no significant change in the next 24 hours.  The treatment plan and use of medications and known side effects were discussed with patient.  Some of the medications used are based on case reports/anecdotal data which are not peer-reviewed and has not been studied using randomized control trials.  Complete risks and long-term side effects are unknown, however in the best clinical judgment they seem to be of some benefit.  Patient agrees with the treatment plan and want to receive these treatments as indicated.  Patient was agreeable to steroids and Actemra.  Hyperglycemia This is most likely due to steroids.  We will check HbA1c.  Place him on sliding scale coverage.  Monitor CBGs.  Hypocalcemia Calcium level was mildly low yesterday.  Seems to be normal this morning.  No further work-up at this time.  Morbid obesity BMI is 38.56  DVT Prophylaxis: Lovenox PUD Prophylaxis: Will initiate Pepcid Code Status: Full code Family Communication: Discussed with the patient Disposition Plan: Management as outlined above.  Continue to mobilize as tolerated.  Wean down oxygen as tolerated.  Possible transfer to  the floor later today.   Medications:  Scheduled: . Chlorhexidine Gluconate Cloth  6 each Topical Q0600  . enoxaparin (LOVENOX) injection  0.5 mg/kg Subcutaneous Q12H  . methylPREDNISolone (SOLU-MEDROL) injection  60 mg Intravenous Q6H   Continuous: . remdesivir 100 mg in NS 250 mL     RUE:[id number]:acetaminophen, ondansetron **OR** ondansetron (ZOFRAN) IV, polyethylene glycol   Objective:  Vital Signs  Vitals:    01/17/19 0600 01/17/19 0700 01/17/19 0730 01/17/19 0821  BP: 113/83   132/85  Pulse: 71 65 65   Resp: 20 19 19    Temp:    98.2 F (36.8 C)  TempSrc:    Oral  SpO2: 93% 96% 97%   Weight:      Height:        Intake/Output Summary (Last 24 hours) at 01/17/2019 1035 Last data filed at 01/17/2019 0300 Gross per 24 hour  Intake 760.03 ml  Output 700 ml  Net 60.03 ml   Filed Weights   01/16/19 1600 01/16/19 1810  Weight: 117.9 kg 121.9 kg    General appearance: Awake alert.  In no distress.  Morbidly obese Resp: Tachypneic at rest.  No use of accessory muscles.  Crackles bilaterally at the bases.  No wheezing or rhonchi. Cardio: S1-S2 is normal regular.  No S3-S4.  No rubs murmurs or bruit GI: Abdomen is soft.  Nontender nondistended.  Bowel sounds are present normal.  No masses organomegaly Extremities: No edema.  Full range of motion of lower extremities. Neurologic: Alert and oriented x3.  No focal neurological deficits.    Lab Results:  Data Reviewed: I have personally reviewed following labs and imaging studies  CBC: Recent Labs  Lab 01/16/19 1150 01/16/19 1206 01/17/19 0606  WBC 8.2  --  3.3*  NEUTROABS 7.1  --  2.6  HGB 15.4 15.3 15.7  HCT 44.3 45.0 46.8  MCV 92.3  --  93.4  PLT 406*  --  359    Basic Metabolic Panel: Recent Labs  Lab 01/16/19 1150 01/16/19 1206 01/17/19 0606  NA 138 138 139  K 3.6 3.6 4.1  CL 102  --  103  CO2 23  --  22  GLUCOSE 175*  --  247*  BUN 10  --  21*  CREATININE 0.69  --  0.53*  CALCIUM 8.8*  --  9.0  MG  --   --  2.3    GFR: Estimated Creatinine Clearance: 152.7 mL/min (A) (by C-G formula based on SCr of 0.53 mg/dL (L)).  Liver Function Tests: Recent Labs  Lab 01/16/19 1150 01/17/19 0606  AST 32 38  ALT 43 53*  ALKPHOS 82 82  BILITOT 0.7 0.3  PROT 7.4 7.5  ALBUMIN 3.2* 3.3*    Lipid Profile: Recent Labs    01/16/19 1150  TRIG 122    Anemia Panel: Recent Labs    01/16/19 1150 01/17/19 0606   FERRITIN 408* 484*    Recent Results (from the past 240 hour(s))  SARS Coronavirus 2 [facility](Hospital order, Performed in [facility] hospital lab)     Status: Abnormal   Collection Time: 01/16/19 11:50 AM   Specimen: Nasopharyngeal Swab  Result Value Ref Range Status   SARS Coronavirus 2 POSITIVE (A) NEGATIVE Final    Comment: RESULT CALLED TO, READ BACK BY AND VERIFIED WITH: Bedelia Person. Hardy RN 13:15 01/16/19 ([email or web address]) (NOTE) If result is NEGATIVE SARS-CoV-2 target nucleic acids are NOT DETECTED. The SARS-CoV-2 RNA is generally detectable in upper and lower  respiratory specimens  during the acute phase of infection. The lowest  concentration of SARS-CoV-2 viral copies this assay can detect is 250  copies / mL. A negative result does not preclude SARS-CoV-2 infection  and should not be used as the sole basis for treatment or other  patient management decisions.  A negative result may occur with  improper specimen collection / handling, submission of specimen other  than nasopharyngeal swab, presence of viral mutation(s) within the  areas targeted by this assay, and inadequate number of viral copies  (<250 copies / mL). A negative result must be combined with clinical  observations, patient history, and epidemiological information. If result is POSITIVE SARS-CoV-2 target nucleic acids are DETECTED. Th e SARS-CoV-2 RNA is generally detectable in upper and lower  respiratory specimens during the acute phase of infection.  Positive  results are indicative of active infection with SARS-CoV-2.  Clinical  correlation with patient history and other diagnostic information is  necessary to determine patient infection status.  Positive results do  not rule out bacterial infection or co-infection with other viruses. If result is PRESUMPTIVE POSTIVE SARS-CoV-2 nucleic acids MAY BE PRESENT.   A presumptive positive result was obtained on the submitted specimen  and confirmed on repeat testing.  While 2019  novel coronavirus  (SARS-CoV-2) nucleic acids may be present in the submitted sample  additional confirmatory testing may be necessary for epidemiological  and / or clinical management purposes  to differentiate between  SARS-CoV-2 and other Sarbecovirus currently known to infect humans.  If clinically indicated additional testing with an alternate test  methodology [phone]([id number]) is  advised. The SARS-CoV-2 RNA is generally  detectable in upper and lower respiratory specimens during the acute  phase of infection. The expected result is Negative. Fact Sheet for Patients:  BoilerBrush.com.cyhttps://www.fda.gov/media/136312/download Fact Sheet for Healthcare Providers: https://pope.com/https://www.fda.gov/media/136313/download This test is not yet approved or cleared by the [place] FDA and has been authorized for detection and/or diagnosis of SARS-CoV-2 by FDA under an Emergency Use Authorization (EUA).  This EUA will remain in effect (meaning this test can be used) for the duration of the COVID-19 declaration under Section 564(b)(1) of the Act, 21 U.S.C. section 360bbb-3(b)(1), unless the authorization is terminated or revoked sooner. Performed at [facility], [address] [place], [place] [address]   MRSA PCR Screening     Status: None   Collection Time: 01/16/19  6:18 PM   Specimen: Nasal Mucosa; Nasopharyngeal  Result Value Ref Range Status   MRSA by PCR NEGATIVE NEGATIVE Final    Comment:        The GeneXpert MRSA Assay (FDA approved for NASAL specimens only), is one component of a comprehensive MRSA colonization surveillance program. It is not intended to diagnose MRSA infection nor to guide or monitor treatment for MRSA infections. Performed at [facility], [address] [place], [place] [address]       Radiology Studies: Dg Chest Port 1 View  Result Date: 01/16/2019 CLINICAL DATA:  Shortness of breath. EXAM: PORTABLE CHEST 1 VIEW COMPARISON:  None. FINDINGS: Mild  cardiomegaly is noted. Hypoinflation of the lungs is noted. Large right lower lobe opacity is noted most consistent with pneumonia. Mild left basilar atelectasis or infiltrate is noted. No pneumothorax is noted. Small pleural effusions cannot be excluded. Bony thorax is unremarkable. IMPRESSION: Hypoinflation of the lungs. Large right lower lobe opacity is noted most consistent with pneumonia. Mild left basilar atelectasis or infiltrate. Electronically Signed   By: Fayrene FearingJames  Murlean Caller M.D.   On: 01/16/2019 12:12       LOS: 1 day   Marylee Belzer [employer] on [email or web address]  01/17/2019, 10:35 AM

## 2019-01-17 NOTE — Progress Notes (Signed)
NAMMariea Cole:  Terry Cole, MRN:  [id number], DOB:  12/02/1970, LOS: 1 ADMISSION DATE:  01/16/2019, CONSULTATION DATE:  01/16/2019 REFERRING MD:  Rito EhrlichKrishnan, CHIEF COMPLAINT:  Dyspnea   Brief History   48 y/o male with no known PMH was admitted with severe acute respiratory failure with hypoxemia due to COVID 19 pneumonia.  Past Medical History  none  Significant Hospital Events     Consults:  PCCM  Procedures:  none  Significant Diagnostic Tests:    Micro Data:  July 8 SARS COV 2 POSITIVE  Antimicrobials:  7/8 Remdesivir >  7/8 Solumedrol >  7/8 Actemra >    Interim history/subjective:  Feels better this morning Weaned to 12 LPM this AM while prone, on 15LPM now  Objective   Blood pressure 113/83, pulse 65, temperature 97.9 F (36.6 C), resp. rate 19, height 5\' 10"  (1.778 m), weight 121.9 kg, SpO2 96 %.    FiO2 (%):  [100 %] 100 %   Intake/Output Summary (Last 24 hours) at 01/17/2019 0757 Last data filed at 01/17/2019 0300 Gross per 24 hour  Intake 760.03 ml  Output 700 ml  Net 60.03 ml   Filed Weights   01/16/19 1600 01/16/19 1810  Weight: 117.9 kg 121.9 kg    Examination:  General:  Resting comfortably in bed HENT: NCAT OP clear PULM: CTA B, normal effort CV: RRR, no mgr GI: BS+, soft, nontender MSK: normal bulk and tone Neuro: awake, alert, no distress, MAEW   Resolved Hospital Problem list     Assessment & Plan:  Severe acute respiratory failure with hypoxemia in the setting of COVID-19 pneumonia: Maintain in ICU for now Continue Solu-Medrol Continue Remdesivir  Consider repeat dose of Actemra, f/u labs today first Continue to administer oxygen via high flow nasal cannula Tolerate periods of hypoxemia, goal at rest is greater than 85% SaO2, with movement ideally above 75% Decision for intubation should be based on a change in mental status or physical evidence of ventilatory failure such as nasal flaring, accessory muscle use, paradoxical breathing  Out of bed to chair as able Prone positioning while in bed Consider transfer to PCU if remains stable on < 15L O2 today  Best practice:  Diet: regular diet Pain/Anxiety/Delirium protocol (if indicated): n/a VAP protocol (if indicated): n/a DVT prophylaxis: lovenox GI prophylaxis: n/a Glucose control: SSI Mobility: out of bed today, range of motion exercises Code Status: full Family Communication: I updated his sister [name]  Disposition: remain in ICU  Labs   CBC: Recent Labs  Lab 01/16/19 1150 01/16/19 1206  WBC 8.2  --   NEUTROABS 7.1  --   HGB 15.4 15.3  HCT 44.3 45.0  MCV 92.3  --   PLT 406*  --     Basic Metabolic Panel: Recent Labs  Lab 01/16/19 1150 01/16/19 1206  NA 138 138  K 3.6 3.6  CL 102  --   CO2 23  --   GLUCOSE 175*  --   BUN 10  --   CREATININE 0.69  --   CALCIUM 8.8*  --    GFR: Estimated Creatinine Clearance: 152.7 mL/min (by C-G formula based on SCr of 0.69 mg/dL). Recent Labs  Lab 01/16/19 1150  PROCALCITON <0.10  WBC 8.2  LATICACIDVEN 1.8    Liver Function Tests: Recent Labs  Lab 01/16/19 1150  AST 32  ALT 43  ALKPHOS 82  BILITOT 0.7  PROT 7.4  ALBUMIN 3.2*   No results for input(s): LIPASE, AMYLASE in  the last 168 hours. No results for input(s): AMMONIA in the last 168 hours.  ABG    Component Value Date/Time   HCO3 24.7 01/16/2019 1206   TCO2 26 01/16/2019 1206   O2SAT 93.0 01/16/2019 1206     Coagulation Profile: No results for input(s): INR, PROTIME in the last 168 hours.  Cardiac Enzymes: No results for input(s): CKTOTAL, CKMB, CKMBINDEX, TROPONINI in the last 168 hours.  HbA1C: No results found for: HGBA1C  CBG: No results for input(s): GLUCAP in the last 168 hours.   Critical care time: 31 minutes    Roselie Awkward, MD [place] [facility] Pager: [phone] Cell: [phone] If no response, call [phone]

## 2019-01-18 DIAGNOSIS — J069 Acute upper respiratory infection, unspecified: Secondary | ICD-10-CM

## 2019-01-18 DIAGNOSIS — U071 COVID-19: Principal | ICD-10-CM

## 2019-01-18 LAB — MAGNESIUM: Magnesium: 2.2 mg/dL (ref 1.7–2.4)

## 2019-01-18 LAB — COMPREHENSIVE METABOLIC PANEL
ALT: 54 U/L — ABNORMAL HIGH (ref 0–44)
AST: 27 U/L (ref 15–41)
Albumin: 3 g/dL — ABNORMAL LOW (ref 3.5–5.0)
Alkaline Phosphatase: 76 U/L (ref 38–126)
Anion gap: 14 (ref 5–15)
BUN: 25 mg/dL — ABNORMAL HIGH (ref 6–20)
CO2: 23 mmol/L (ref 22–32)
Calcium: 8.7 mg/dL — ABNORMAL LOW (ref 8.9–10.3)
Chloride: 101 mmol/L (ref 98–111)
Creatinine, Ser: 0.52 mg/dL — ABNORMAL LOW (ref 0.61–1.24)
GFR calc Af Amer: >60 mL/min (ref >60)
GFR calc non Af Amer: >60 mL/min (ref >60)
Glucose, Bld: 154 mg/dL — ABNORMAL HIGH (ref 70–99)
Potassium: 3.9 mmol/L (ref 3.5–5.1)
Sodium: 138 mmol/L (ref 135–145)
Total Bilirubin: 0.5 mg/dL (ref 0.3–1.2)
Total Protein: 6.9 g/dL (ref 6.5–8.1)

## 2019-01-18 LAB — CBC WITH DIFFERENTIAL/PLATELET
Abs Immature Granulocytes: 0.07 10*3/uL (ref 0.00–0.07)
Basophils Absolute: 0 10*3/uL (ref 0.0–0.1)
Basophils Relative: 0 %
Eosinophils Absolute: 0 10*3/uL (ref 0.0–0.5)
Eosinophils Relative: 0 %
HCT: 44.5 % (ref 39.0–52.0)
Hemoglobin: 14.9 g/dL (ref 13.0–17.0)
Immature Granulocytes: 1 %
Lymphocytes Relative: 7 %
Lymphs Abs: 0.6 10*3/uL — ABNORMAL LOW (ref 0.7–4.0)
MCH: 31.6 pg (ref 26.0–34.0)
MCHC: 33.5 g/dL (ref 30.0–36.0)
MCV: 94.3 fL (ref 80.0–100.0)
Monocytes Absolute: 0.3 10*3/uL (ref 0.1–1.0)
Monocytes Relative: 4 %
Neutro Abs: 7 10*3/uL (ref 1.7–7.7)
Neutrophils Relative %: 88 %
Platelets: 501 10*3/uL — ABNORMAL HIGH (ref 150–400)
RBC: 4.72 MIL/uL (ref 4.22–5.81)
RDW: 12.1 % (ref 11.5–15.5)
WBC: 8 10*3/uL (ref 4.0–10.5)
nRBC: 0 % (ref 0.0–0.2)

## 2019-01-18 LAB — TYPE AND SCREEN
ABO/RH(D): O POS
Antibody Screen: NEGATIVE

## 2019-01-18 LAB — GLUCOSE, CAPILLARY
Glucose-Capillary: 180 mg/dL — ABNORMAL HIGH (ref 70–99)
Glucose-Capillary: 205 mg/dL — ABNORMAL HIGH (ref 70–99)
Glucose-Capillary: 222 mg/dL — ABNORMAL HIGH (ref 70–99)
Glucose-Capillary: 404 mg/dL — ABNORMAL HIGH (ref 70–99)
Glucose-Capillary: 298 mg/dL — ABNORMAL HIGH (ref 70–99)
Glucose-Capillary: 308 mg/dL — ABNORMAL HIGH (ref 70–99)

## 2019-01-18 LAB — C-REACTIVE PROTEIN: CRP: 7.9 mg/dL — ABNORMAL HIGH (ref <1.0)

## 2019-01-18 LAB — FERRITIN: Ferritin: 440 ng/mL — ABNORMAL HIGH (ref 24–336)

## 2019-01-18 LAB — D-DIMER, QUANTITATIVE: D-Dimer, Quant: 0.4 ug/mL-FEU (ref 0.00–0.50)

## 2019-01-18 MED ORDER — SODIUM CHLORIDE 0.9% IV SOLUTION
Freq: Once | INTRAVENOUS | Status: AC
  Administered 2019-01-18: 20:00:00 via INTRAVENOUS

## 2019-01-18 NOTE — Progress Notes (Signed)
NAMMariea Clonts:  Dallas Klier, MRN:  [id number], DOB:  12/05/1970, LOS: 2 ADMISSION DATE:  01/16/2019, CONSULTATION DATE:  01/16/2019 REFERRING MD:  Rito EhrlichKrishnan, CHIEF COMPLAINT:  Dyspnea   Brief History   48 y/o male with no known PMH was admitted on 7/8 with severe acute respiratory failure with hypoxemia due to COVID 19 pneumonia.  Past Medical History  none  Significant Hospital Events   July 9 transferred to the floor  Consults:  PCCM  Procedures:  none  Significant Diagnostic Tests:    Micro Data:  July 8 SARS COV 2 POSITIVE  Antimicrobials:  7/8 Remdesivir >  7/8 Solumedrol >  7/8 Actemra >    Interim history/subjective:  Moved to the floor yesterday Feeling better today, slept well overnight, no complaints.  Little cough, no mucus production, denies shortness of breath.  Denies pain of any kind.  Objective   Blood pressure 110/72, pulse 88, temperature 97.8 F (36.6 C), temperature source Oral, resp. rate (!) 25, height 5\' 10"  (1.778 m), weight 121.9 kg, SpO2 (!) 88 %.    FiO2 (%):  [100 %] 100 %   Intake/Output Summary (Last 24 hours) at 01/18/2019 1027 Last data filed at 01/18/2019 0344 Gross per 24 hour  Intake 970.16 ml  Output 1750 ml  Net -779.84 ml   Filed Weights   01/16/19 1600 01/16/19 1810  Weight: 117.9 kg 121.9 kg    Examination:  General:  Resting comfortably in chair HENT: NCAT OP clear PULM: even, non labored symmetric air entry CV: well perfused no mgr GI: , soft, nontender MSK: normal bulk and tone Neuro: awake, alert, no distress, MAEW    Resolved Hospital Problem list     Assessment & Plan:  Severe acute respiratory failure with hypoxemia in the setting of COVID-19 pneumonia: Continue Solu-Medrol Agree with plan for convalescent plasma Continue remdesivir Tolerate periods of hypoxemia, goal at rest is greater than 85% SaO2, with movement ideally above 75% Decision for intubation should be based on a change in mental status or  physical evidence of ventilatory failure such as nasal flaring, accessory muscle use, paradoxical breathing Out of bed to chair as able Prone positioning while in bed  PCCM available as needed  Best practice:  Diet: regular diet Pain/Anxiety/Delirium protocol (if indicated): n/a VAP protocol (if indicated): n/a DVT prophylaxis: lovenox GI prophylaxis: n/a Glucose control: SSI Mobility: out of bed today, range of motion exercises Code Status: full Family Communication: I updated his sister [name] 7/9 by phone.   Disposition: remain in ICU  Labs   CBC: Recent Labs  Lab 01/16/19 1150 01/16/19 1206 01/17/19 0606 01/18/19 0230  WBC 8.2  --  3.3* 8.0  NEUTROABS 7.1  --  2.6 7.0  HGB 15.4 15.3 15.7 14.9  HCT 44.3 45.0 46.8 44.5  MCV 92.3  --  93.4 94.3  PLT 406*  --  359 501*    Basic Metabolic Panel: Recent Labs  Lab 01/16/19 1150 01/16/19 1206 01/17/19 0606 01/18/19 0230  NA 138 138 139 138  K 3.6 3.6 4.1 3.9  CL 102  --  103 101  CO2 23  --  22 23  GLUCOSE 175*  --  247* 154*  BUN 10  --  21* 25*  CREATININE 0.69  --  0.53* 0.52*  CALCIUM 8.8*  --  9.0 8.7*  MG  --   --  2.3 2.2   GFR: Estimated Creatinine Clearance: 152.7 mL/min (A) (by C-G formula based on SCr of  0.52 mg/dL (L)). Recent Labs  Lab 01/16/19 1150 01/17/19 0606 01/18/19 0230  PROCALCITON <0.10  --   --   WBC 8.2 3.3* 8.0  LATICACIDVEN 1.8  --   --     Liver Function Tests: Recent Labs  Lab 01/16/19 1150 01/17/19 0606 01/18/19 0230  AST 32 38 27  ALT 43 53* 54*  ALKPHOS 82 82 76  BILITOT 0.7 0.3 0.5  PROT 7.4 7.5 6.9  ALBUMIN 3.2* 3.3* 3.0*   No results for input(s): LIPASE, AMYLASE in the last 168 hours. No results for input(s): AMMONIA in the last 168 hours.  ABG    Component Value Date/Time   HCO3 24.7 01/16/2019 1206   TCO2 26 01/16/2019 1206   O2SAT 93.0 01/16/2019 1206     Coagulation Profile: No results for input(s): INR, PROTIME in the last 168 hours.  Cardiac  Enzymes: No results for input(s): CKTOTAL, CKMB, CKMBINDEX, TROPONINI in the last 168 hours.  HbA1C: No results found for: HGBA1C  CBG: Recent Labs  Lab 01/17/19 1728 01/17/19 2218 01/18/19 0010 01/18/19 0329 01/18/19 0752  GLUCAP 272* 339* 222* 180* 205*     Critical care time: n/a    Roselie Awkward, MD [facility] Pager: [phone] Cell: [phone] If no response, call [phone]

## 2019-01-18 NOTE — Progress Notes (Signed)
Inpatient Diabetes Program Recommendations  AACE/ADA: New Consensus Statement on Inpatient Glycemic Control (2015)  Target Ranges:  Prepandial:   less than 140 mg/dL      Peak postprandial:   less than 180 mg/dL (1-2 hours)      Critically ill patients:  140 - 180 mg/dL   Results for Terry Cole, Terry Cole (MRN [id number]) as of 01/18/2019 13:01  Ref. Range 01/17/2019 12:09 01/17/2019 17:28 01/17/2019 22:18 01/18/2019 00:10 01/18/2019 03:29 01/18/2019 07:52  Glucose-Capillary Latest Ref Range: 70 - 99 mg/dL 285 (H)  11 units NOVOLOG  272 (H)  11 units NOVOLOG  339 (H)  15 units NOVOLOG  222 (H) 180 (H)  4 units NOVOLOG  205 (H)  7 units NOVOLOG      Admit with: COVID 19  NO History of Diabetes noted  Current Orders: Novolog Resistant Correction Scale/ SSI (0-20 units) Q4 hours      Note patient getting Solumedrol 60 mg BID.  NO History of Diabetes noted--Current A1c pending.  Novolog SSI started yest at 12pm.    MD- Note CBGs showing some improvement since Novolog SSi started.  CBGs since MN: 222/ 180/ 205.  If patient continues to have CBG elevations >200, may consider adding basal insulin to inpatient regimen:  Lantus 12 units Daily (0.1 units/kg dosing)      --Will follow patient during hospitalization--  Wyn Quaker RN, MSN, CDE Diabetes Coordinator Inpatient Glycemic Control Team Team Pager: [phone] (8a-5p)

## 2019-01-18 NOTE — Progress Notes (Signed)
I, Marzetta Board, MD, consented Subject Terry Cole (male, Date of Birth June 12, 1971, 48 y.o.) and with diagnosis of COVID-19, in the [facility] Expanded Access Program (EAP) Research Protocol for [employer] against COVID-19.  The consent took place under following circumstances.   Subject Capacity assessed by this investigator as:  Presence of adequate emotional and mental capacity to consent with normal ability to read and write.  Consent took place in the following setting(s):  In-room, face to face   The following were present for the consent process:  Investigator   A copy of the cover letter and signed consent document was provided to subject/LAR.  The original signed consent document has been placed in the subject's physical chart and will be scanned into the electronic medical record upon discharge.  Statement of acknowledgement that the following was discussed with the subject/LAR:    1) Discussed the purpose of the research and procedures  2) Discussed risks and benefits and uncertainties of study participation 3) Discussed subject's responsibilities  4) Discussed the measures in place to maintain subject's confidentiality while a participant on the trial  5) Discussed alternatives to study participation.   6) Discussed study participation is voluntary and that the subject's care would not be jeopardized if they declined participation in the study.   7) Discussed freedom to withdraw at any time.   8) All subject/LAR questions were answered to their satisfaction.   9) In case of emergency consent, investigator agreed to discuss with subject/LAR at earliest available opportunity when the subject stabilizes and/or LAR can be located.     Final Investigator Signature  [place], [place] [phone]   Date: 01/18/2019 and 9:48 AM

## 2019-01-18 NOTE — Plan of Care (Signed)
Discussed with patient plan of care for the evening, pain management and encouraged use of incentive spirometry with some teach back displayed.  Patient uopdated family from his phone while I was in the room.

## 2019-01-18 NOTE — Progress Notes (Addendum)
PROGRESS NOTE  Terry Cole ZOX:[id number] DOB: 09/10/1970 DOA: 01/16/2019 PCP: Patient, No Pcp Per   LOS: 2 days   Brief Narrative / Interim history: 48 year old male without significant past medical history, comes to the hospital and is admitted on 01/16/2019 with 3 to 4 days of fever, cough, shortness of breath, symptoms progressively worsening and decided to seek care.  He was initially admitted to the ICU due to high oxygen needs.  He has been stable in the ICU, and transferred to the floor on 01/17/2019  Subjective: Appears comfortable laying in bed, denies any shortness of breath, denies any chest pain, no nausea or vomiting.  Patient seen via tele-[employer] interpreter.  He does appear tachypneic  Assessment & Plan: Principal Problem:   Acute respiratory disease due to COVID-19 virus Active Problems:   Hypoxia   Principal Problem Acute Hypoxic Respiratory Failure due to Covid-19 Viral Illness -Patient's respiratory status remains tenuous, improved some on 12 L last night but back on 15 L this morning maintaining O2 sats in the upper 80s. -prone as able  FiO2 (%):  [100 %] 100 %    The treatment plan and use of medications and known side effects were discussed with patient/family, they were clearly explained that there is no proven definitive treatment for COVID-19 infection, any medications used here are based on published clinical articles/anecdotal data which are not peer-reviewed or randomized control trials.  Complete risks and long-term side effects are unknown, however in the best clinical judgment they seem to be of some clinical benefit rather than medical risks.  Patient agree with the treatment plan and want to receive the given medications.  COVID-19 Labs  Recent Labs    01/16/19 1150 01/17/19 0606 01/18/19 0230  DDIMER 0.42 0.51* 0.40  FERRITIN 408* 484* 440*  LDH 287*  --   --   CRP 16.8* 16.5* 7.9*    Lab Results  Component Value Date   SARSCOV2NAA  POSITIVE (A) 01/16/2019   Fever:  Afebrile Oxygen requirements:  15 L high flow, 100% FiO2 Antibiotics: No antibacterials Remdesivir:  7/8, day 3 Steroids:  Solu-Medrol 7/8, day 3 Diuretics:  None Actemra:  2 doses, 7/8, 7/9 Convalescent Plasma:  Consented on 7/10 Vitamin C and Zinc:  Yes  Active Problems Hyperglycemia -Likely in the setting of steroids, A1c pending  Morbid obesity -Will benefit from weight loss  Scheduled Meds: . sodium chloride   Intravenous Once  . Chlorhexidine Gluconate Cloth  6 each Topical Q0600  . enoxaparin (LOVENOX) injection  0.5 mg/kg Subcutaneous Q12H  . famotidine  20 mg Oral BID  . insulin aspart  0-20 Units Subcutaneous Q4H  . methylPREDNISolone (SOLU-MEDROL) injection  60 mg Intravenous Q12H  . vitamin C  500 mg Oral Daily  . zinc sulfate  220 mg Oral Daily   Continuous Infusions: . remdesivir 100 mg in NS 250 mL Stopped (01/17/19 1845)   PRN Meds:.acetaminophen, ondansetron **OR** ondansetron (ZOFRAN) IV, polyethylene glycol  DVT prophylaxis: Lovenox Code Status: Full code Family Communication: d/w patient  Disposition Plan: home when ready   Consultants:   PCCM   Procedures:   None   Antimicrobials:  None    Objective: Vitals:   01/18/19 0600 01/18/19 0610 01/18/19 0754 01/18/19 0941  BP:      Pulse: (!) 59   88  Resp: (!) 23 18  (!) 25  Temp:   97.8 F (36.6 C)   TempSrc:   Oral   SpO2: 98%   Marland Kitchen(!)  88%  Weight:      Height:        Intake/Output Summary (Last 24 hours) at 01/18/2019 1052 Last data filed at 01/18/2019 0344 Gross per 24 hour  Intake 970.16 ml  Output 1750 ml  Net -779.84 ml   Filed Weights   01/16/19 1600 01/16/19 1810  Weight: 117.9 kg 121.9 kg    Examination:  Constitutional: NAD. Eyes: PERRL, lids and conjunctivae normal, no scleral icterus ENMT: Mucous membranes are moist.  Neck: normal, supple Respiratory: Bibasilar rhonchi, no wheezing, tachypneic.  Slightly increased respiratory  effort Cardiovascular: Regular rate and rhythm, no murmurs / rubs / gallops. No LE edema.  Abdomen: no tenderness. Bowel sounds positive.  Musculoskeletal: no clubbing / cyanosis.  Skin: no rashes Neurologic: CN 2-12 grossly intact. Strength 5/5 in all 4.  Psychiatric: Normal judgment and insight. Alert and oriented x 3. Normal mood.    Data Reviewed: I have independently reviewed following labs and imaging studies chest x-ray, infiltrate right and left lower lobes  CBC: Recent Labs  Lab 01/16/19 1150 01/16/19 1206 01/17/19 0606 01/18/19 0230  WBC 8.2  --  3.3* 8.0  NEUTROABS 7.1  --  2.6 7.0  HGB 15.4 15.3 15.7 14.9  HCT 44.3 45.0 46.8 44.5  MCV 92.3  --  93.4 94.3  PLT 406*  --  359 501*   Basic Metabolic Panel: Recent Labs  Lab 01/16/19 1150 01/16/19 1206 01/17/19 0606 01/18/19 0230  NA 138 138 139 138  K 3.6 3.6 4.1 3.9  CL 102  --  103 101  CO2 23  --  22 23  GLUCOSE 175*  --  247* 154*  BUN 10  --  21* 25*  CREATININE 0.69  --  0.53* 0.52*  CALCIUM 8.8*  --  9.0 8.7*  MG  --   --  2.3 2.2   GFR: Estimated Creatinine Clearance: 152.7 mL/min (A) (by C-G formula based on SCr of 0.52 mg/dL (L)). Liver Function Tests: Recent Labs  Lab 01/16/19 1150 01/17/19 0606 01/18/19 0230  AST 32 38 27  ALT 43 53* 54*  ALKPHOS 82 82 76  BILITOT 0.7 0.3 0.5  PROT 7.4 7.5 6.9  ALBUMIN 3.2* 3.3* 3.0*   No results for input(s): LIPASE, AMYLASE in the last 168 hours. No results for input(s): AMMONIA in the last 168 hours. Coagulation Profile: No results for input(s): INR, PROTIME in the last 168 hours. Cardiac Enzymes: No results for input(s): CKTOTAL, CKMB, CKMBINDEX, TROPONINI in the last 168 hours. BNP (last 3 results) No results for input(s): PROBNP in the last 8760 hours. HbA1C: No results for input(s): HGBA1C in the last 72 hours. CBG: Recent Labs  Lab 01/17/19 1728 01/17/19 2218 01/18/19 0010 01/18/19 0329 01/18/19 0752  GLUCAP 272* 339* 222* 180* 205*    Lipid Profile: Recent Labs    01/16/19 1150  TRIG 122   Thyroid Function Tests: No results for input(s): TSH, T4TOTAL, FREET4, T3FREE, THYROIDAB in the last 72 hours. Anemia Panel: Recent Labs    01/17/19 0606 01/18/19 0230  FERRITIN 484* 440*   Urine analysis: No results found for: COLORURINE, APPEARANCEUR, LABSPEC, PHURINE, GLUCOSEU, HGBUR, BILIRUBINUR, KETONESUR, PROTEINUR, UROBILINOGEN, NITRITE, LEUKOCYTESUR Sepsis Labs: Invalid input(s): PROCALCITONIN, LACTICIDVEN  Recent Results (from the past 240 hour(s))  Blood Culture (routine x 2)     Status: None (Preliminary result)   Collection Time: 01/16/19 11:25 AM   Specimen: BLOOD  Result Value Ref Range Status   Specimen Description BLOOD LEFT  ANTECUBITAL  Final   Special Requests   Final    BOTTLES DRAWN AEROBIC AND ANAEROBIC Blood Culture adequate volume   Culture   Final    NO GROWTH < 24 HOURS Performed at [facility], [address] [place], [place] [address]    Report Status PENDING  Incomplete  Blood Culture (routine x 2)     Status: None (Preliminary result)   Collection Time: 01/16/19 11:25 AM   Specimen: BLOOD  Result Value Ref Range Status   Specimen Description BLOOD RIGHT ANTECUBITAL  Final   Special Requests   Final    BOTTLES DRAWN AEROBIC AND ANAEROBIC Blood Culture adequate volume   Culture   Final    NO GROWTH < 24 HOURS Performed at [facility], [address] [place], [place] [address]    Report Status PENDING  Incomplete  SARS Coronavirus 2 [facility](Hospital order, Performed in [facility] hospital lab)     Status: Abnormal   Collection Time: 01/16/19 11:50 AM   Specimen: Nasopharyngeal Swab  Result Value Ref Range Status   SARS Coronavirus 2 POSITIVE (A) NEGATIVE Final    Comment: RESULT CALLED TO, READ BACK BY AND VERIFIED WITH: Bedelia Person. Hardy RN 13:15 01/16/19 ([email or web address]) (NOTE) If result is NEGATIVE SARS-CoV-2 target nucleic acids are NOT DETECTED. The SARS-CoV-2 RNA is generally  detectable in upper and lower  respiratory specimens during the acute phase of infection. The lowest  concentration of SARS-CoV-2 viral copies this assay can detect is 250  copies / mL. A negative result does not preclude SARS-CoV-2 infection  and should not be used as the sole basis for treatment or other  patient management decisions.  A negative result may occur with  improper specimen collection / handling, submission of specimen other  than nasopharyngeal swab, presence of viral mutation(s) within the  areas targeted by this assay, and inadequate number of viral copies  (<250 copies / mL). A negative result must be combined with clinical  observations, patient history, and epidemiological information. If result is POSITIVE SARS-CoV-2 target nucleic acids are DETECTED. Th e SARS-CoV-2 RNA is generally detectable in upper and lower  respiratory specimens during the acute phase of infection.  Positive  results are indicative of active infection with SARS-CoV-2.  Clinical  correlation with patient history and other diagnostic information is  necessary to determine patient infection status.  Positive results do  not rule out bacterial infection or co-infection with other viruses. If result is PRESUMPTIVE POSTIVE SARS-CoV-2 nucleic acids MAY BE PRESENT.   A presumptive positive result was obtained on the submitted specimen  and confirmed on repeat testing.  While 2019 novel coronavirus  (SARS-CoV-2) nucleic acids may be present in the submitted sample  additional confirmatory testing may be necessary for epidemiological  and / or clinical management purposes  to differentiate between  SARS-CoV-2 and other Sarbecovirus currently known to infect humans.  If clinically indicated additional testing with an alternate test  methodology [phone] is  advised. The SARS-CoV-2 RNA is generally  detectable in upper and lower respiratory specimens during the acute  phase of infection. The  expected result is Negative. Fact Sheet for Patients:  BoilerBrush.com.cyhttps://www.fda.gov/media/136312/download Fact Sheet for Healthcare Providers: https://pope.com/https://www.fda.gov/media/136313/download This test is not yet approved or cleared by the [place] FDA and has been authorized for detection and/or diagnosis of SARS-CoV-2 by FDA under an Emergency Use Authorization (EUA).  This EUA will remain in effect (meaning this test can be used) for the  duration of the COVID-19 declaration under Section 564(b)(1) of the Act, 21 U.S.C. section 360bbb-3(b)(1), unless the authorization is terminated or revoked sooner. Performed at [facility], [place] [address], [place], [place] [address]   MRSA PCR Screening     Status: None   Collection Time: 01/16/19  6:18 PM   Specimen: Nasal Mucosa; Nasopharyngeal  Result Value Ref Range Status   MRSA by PCR NEGATIVE NEGATIVE Final    Comment:        The GeneXpert MRSA Assay (FDA approved for NASAL specimens only), is one component of a comprehensive MRSA colonization surveillance program. It is not intended to diagnose MRSA infection nor to guide or monitor treatment for MRSA infections. Performed at [facility], [place] [address], [place], [place] [address]       Radiology Studies: Dg Chest Port 1 View  Result Date: 01/16/2019 CLINICAL DATA:  Shortness of breath. EXAM: PORTABLE CHEST 1 VIEW COMPARISON:  None. FINDINGS: Mild cardiomegaly is noted. Hypoinflation of the lungs is noted. Large right lower lobe opacity is noted most consistent with pneumonia. Mild left basilar atelectasis or infiltrate is noted. No pneumothorax is noted. Small pleural effusions cannot be excluded. Bony thorax is unremarkable. IMPRESSION: Hypoinflation of the lungs. Large right lower lobe opacity is noted most consistent with pneumonia. Mild left basilar atelectasis or infiltrate. Electronically Signed   By: Marijo Conception M.D.   On: 01/16/2019 12:12   Time spent: 35  minutes, and 2 separate visits, more than 50% at bedside discussing convalescent plasma risks/benefits, overseen the consent form answering all patient's questions via interpreter  Marzetta Board, MD, PhD [facility]  Contact via  [email or web address]  [place] P: [phone] F: [phone]

## 2019-01-19 LAB — COMPREHENSIVE METABOLIC PANEL
ALT: 46 U/L — ABNORMAL HIGH (ref 0–44)
AST: 27 U/L (ref 15–41)
Albumin: 3.2 g/dL — ABNORMAL LOW (ref 3.5–5.0)
Alkaline Phosphatase: 79 U/L (ref 38–126)
Anion gap: 11 (ref 5–15)
BUN: 31 mg/dL — ABNORMAL HIGH (ref 6–20)
CO2: 26 mmol/L (ref 22–32)
Calcium: 8.8 mg/dL — ABNORMAL LOW (ref 8.9–10.3)
Chloride: 105 mmol/L (ref 98–111)
Creatinine, Ser: 0.62 mg/dL (ref 0.61–1.24)
GFR calc Af Amer: >60 mL/min (ref >60)
GFR calc non Af Amer: >60 mL/min (ref >60)
Glucose, Bld: 59 mg/dL — ABNORMAL LOW (ref 70–99)
Potassium: 3.3 mmol/L — ABNORMAL LOW (ref 3.5–5.1)
Sodium: 142 mmol/L (ref 135–145)
Total Bilirubin: 0.3 mg/dL (ref 0.3–1.2)
Total Protein: 6.7 g/dL (ref 6.5–8.1)

## 2019-01-19 LAB — CBC WITH DIFFERENTIAL/PLATELET
Abs Immature Granulocytes: 0.05 10*3/uL (ref 0.00–0.07)
Basophils Absolute: 0 10*3/uL (ref 0.0–0.1)
Basophils Relative: 0 %
Eosinophils Absolute: 0 10*3/uL (ref 0.0–0.5)
Eosinophils Relative: 0 %
HCT: 42.1 % (ref 39.0–52.0)
Hemoglobin: 13.8 g/dL (ref 13.0–17.0)
Immature Granulocytes: 0 %
Lymphocytes Relative: 11 %
Lymphs Abs: 1.3 10*3/uL (ref 0.7–4.0)
MCH: 31.1 pg (ref 26.0–34.0)
MCHC: 32.8 g/dL (ref 30.0–36.0)
MCV: 94.8 fL (ref 80.0–100.0)
Monocytes Absolute: 0.8 10*3/uL (ref 0.1–1.0)
Monocytes Relative: 7 %
Neutro Abs: 9.7 10*3/uL — ABNORMAL HIGH (ref 1.7–7.7)
Neutrophils Relative %: 82 %
Platelets: 599 10*3/uL — ABNORMAL HIGH (ref 150–400)
RBC: 4.44 MIL/uL (ref 4.22–5.81)
RDW: 12.1 % (ref 11.5–15.5)
WBC: 11.9 10*3/uL — ABNORMAL HIGH (ref 4.0–10.5)
nRBC: 0 % (ref 0.0–0.2)

## 2019-01-19 LAB — GLUCOSE, CAPILLARY
Glucose-Capillary: 84 mg/dL (ref 70–99)
Glucose-Capillary: 115 mg/dL — ABNORMAL HIGH (ref 70–99)
Glucose-Capillary: 289 mg/dL — ABNORMAL HIGH (ref 70–99)
Glucose-Capillary: 381 mg/dL — ABNORMAL HIGH (ref 70–99)
Glucose-Capillary: 328 mg/dL — ABNORMAL HIGH (ref 70–99)

## 2019-01-19 LAB — MAGNESIUM: Magnesium: 2.2 mg/dL (ref 1.7–2.4)

## 2019-01-19 LAB — C-REACTIVE PROTEIN: CRP: 4 mg/dL — ABNORMAL HIGH (ref <1.0)

## 2019-01-19 LAB — HEMOGLOBIN A1C
Hgb A1c MFr Bld: 10.7 % — ABNORMAL HIGH (ref 4.8–5.6)
Mean Plasma Glucose: 260 mg/dL

## 2019-01-19 LAB — FERRITIN: Ferritin: 445 ng/mL — ABNORMAL HIGH (ref 24–336)

## 2019-01-19 LAB — D-DIMER, QUANTITATIVE: D-Dimer, Quant: 0.47 ug/mL-FEU (ref 0.00–0.50)

## 2019-01-19 MED ORDER — POTASSIUM CHLORIDE CRYS ER 20 MEQ PO TBCR
40.0000 meq | EXTENDED_RELEASE_TABLET | Freq: Once | ORAL | Status: AC
  Administered 2019-01-19: 40 meq via ORAL
  Filled 2019-01-19: qty 2

## 2019-01-19 NOTE — Plan of Care (Signed)
Discussed with patient plan of care for the evening, pain management, medications and plasma with some teach back displayed.  Patient was on the phone updating his family.

## 2019-01-19 NOTE — Progress Notes (Signed)
PROGRESS NOTE  Terry Cole IRW:[id number] DOB: 1971-03-29 DOA: 01/16/2019 PCP: Patient, No Pcp Per   LOS: 3 days   Brief Narrative / Interim history: 48 year old male without significant past medical history, comes to the hospital and is admitted on 01/16/2019 with 3 to 4 days of fever, cough, shortness of breath, symptoms progressively worsening and decided to seek care.  He was initially admitted to the ICU due to high oxygen needs.  He has been stable in the ICU, and transferred to the floor on 01/17/2019  Subjective: Use of [employer] interpreter for this encounter.  Laying in bed and eating breakfast.  He denies any shortness of breath, tells me that he is feeling better.  He is somewhat emotional about everything that is going on  Assessment & Plan: Principal Problem:   Acute respiratory disease due to COVID-19 virus Active Problems:   Hypoxia   Principal Problem Acute Hypoxic Respiratory Failure due to Covid-19 Viral Illness -Respiratory status continues to remain tenuous, he is on 15 L high flow nasal cannula and sats of 88-90% at rest, desatted into the 70s with minimal activity -Will be a slow process and will need ongoing hospital care  COVID-19 Labs  Recent Labs    01/16/19 1150 01/17/19 0606 01/18/19 0230 01/19/19 0315  DDIMER 0.42 0.51* 0.40 0.47  FERRITIN 408* 484* 440* 445*  LDH 287*  --   --   --   CRP 16.8* 16.5* 7.9* 4.0*    Lab Results  Component Value Date   SARSCOV2NAA POSITIVE (A) 01/16/2019   Fever:  Afebrile Oxygen requirements:  15 L high flow, 100% FiO2 Antibiotics: No antibacterials Remdesivir:  7/8, day 4 Steroids:  Solu-Medrol 7/8, day 4 Diuretics:  None Actemra:  2 doses, 7/8, 7/9 Convalescent Plasma: 7/10 Vitamin C and Zinc:  Yes  Active Problems Hyperglycemia -Hemoglobin A1c 10.7, will likely need insulin at home  Morbid obesity -Will benefit from weight loss  Scheduled Meds: . Chlorhexidine Gluconate Cloth  6 each Topical Q0600   . enoxaparin (LOVENOX) injection  0.5 mg/kg Subcutaneous Q12H  . famotidine  20 mg Oral BID  . insulin aspart  0-20 Units Subcutaneous Q4H  . methylPREDNISolone (SOLU-MEDROL) injection  60 mg Intravenous Q12H  . vitamin C  500 mg Oral Daily  . zinc sulfate  220 mg Oral Daily   Continuous Infusions: . remdesivir 100 mg in NS 250 mL Stopped (01/18/19 2215)   PRN Meds:.acetaminophen, ondansetron **OR** ondansetron (ZOFRAN) IV, polyethylene glycol  DVT prophylaxis: Lovenox Code Status: Full code Family Communication: d/w patient  Disposition Plan: home when ready   Consultants:   PCCM   Procedures:   None   Antimicrobials:  None    Objective: Vitals:   01/19/19 0500 01/19/19 0520 01/19/19 0600 01/19/19 0752  BP:    97/67  Pulse: 66 (!) 54 (!) 47 62  Resp: (!) 23 18 17  (!) 23  Temp:    97.8 F (36.6 C)  TempSrc:    Oral  SpO2: (!) 88% 93% 96% (!) 87%  Weight:      Height:        Intake/Output Summary (Last 24 hours) at 01/19/2019 1119 Last data filed at 01/19/2019 0526 Gross per 24 hour  Intake 1844.17 ml  Output 1425 ml  Net 419.17 ml   Filed Weights   01/16/19 1600 01/16/19 1810  Weight: 117.9 kg 121.9 kg    Examination:  Constitutional: Tachypneic Eyes: No scleral icterus ENMT: Moist mucous membranes Neck: normal, supple  Respiratory: Bibasilar rhonchi, no wheezing, tachypneic, increased respiratory effort and quite uncomfortable with this movement Cardiovascular: Regular rate and rhythm, no murmurs.  No peripheral edema Abdomen: Soft, nontender, nondistended, positive bowel sounds Musculoskeletal: no clubbing / cyanosis.  Skin: No rashes seen Neurologic: No focal deficits, equal strength Psychiatric: Alert and oriented x3.  Mildly anxious   Data Reviewed: I have independently reviewed following labs  CBC: Recent Labs  Lab 01/16/19 1150 01/16/19 1206 01/17/19 0606 01/18/19 0230 01/19/19 0315  WBC 8.2  --  3.3* 8.0 11.9*  NEUTROABS 7.1  --   2.6 7.0 9.7*  HGB 15.4 15.3 15.7 14.9 13.8  HCT 44.3 45.0 46.8 44.5 42.1  MCV 92.3  --  93.4 94.3 94.8  PLT 406*  --  359 501* 599*   Basic Metabolic Panel: Recent Labs  Lab 01/16/19 1150 01/16/19 1206 01/17/19 0606 01/18/19 0230 01/19/19 0315  NA 138 138 139 138 142  K 3.6 3.6 4.1 3.9 3.3*  CL 102  --  103 101 105  CO2 23  --  22 23 26   GLUCOSE 175*  --  247* 154* 59*  BUN 10  --  21* 25* 31*  CREATININE 0.69  --  0.53* 0.52* 0.62  CALCIUM 8.8*  --  9.0 8.7* 8.8*  MG  --   --  2.3 2.2 2.2   GFR: Estimated Creatinine Clearance: 152.7 mL/min (by C-G formula based on SCr of 0.62 mg/dL). Liver Function Tests: Recent Labs  Lab 01/16/19 1150 01/17/19 0606 01/18/19 0230 01/19/19 0315  AST 32 38 27 27  ALT 43 53* 54* 46*  ALKPHOS 82 82 76 79  BILITOT 0.7 0.3 0.5 0.3  PROT 7.4 7.5 6.9 6.7  ALBUMIN 3.2* 3.3* 3.0* 3.2*   No results for input(s): LIPASE, AMYLASE in the last 168 hours. No results for input(s): AMMONIA in the last 168 hours. Coagulation Profile: No results for input(s): INR, PROTIME in the last 168 hours. Cardiac Enzymes: No results for input(s): CKTOTAL, CKMB, CKMBINDEX, TROPONINI in the last 168 hours. BNP (last 3 results) No results for input(s): PROBNP in the last 8760 hours. HbA1C: Recent Labs    01/18/19 0230  HGBA1C 10.7*   CBG: Recent Labs  Lab 01/18/19 1154 01/18/19 1612 01/18/19 1957 01/19/19 0450 01/19/19 0738  GLUCAP 404* 298* 308* 84 115*   Lipid Profile: Recent Labs    01/16/19 1150  TRIG 122   Thyroid Function Tests: No results for input(s): TSH, T4TOTAL, FREET4, T3FREE, THYROIDAB in the last 72 hours. Anemia Panel: Recent Labs    01/18/19 0230 01/19/19 0315  FERRITIN 440* 445*   Urine analysis: No results found for: COLORURINE, APPEARANCEUR, LABSPEC, PHURINE, GLUCOSEU, HGBUR, BILIRUBINUR, KETONESUR, PROTEINUR, UROBILINOGEN, NITRITE, LEUKOCYTESUR Sepsis Labs: Invalid input(s): PROCALCITONIN, LACTICIDVEN  Recent  Results (from the past 240 hour(s))  Blood Culture (routine x 2)     Status: None (Preliminary result)   Collection Time: 01/16/19 11:25 AM   Specimen: BLOOD  Result Value Ref Range Status   Specimen Description BLOOD LEFT ANTECUBITAL  Final   Special Requests   Final    BOTTLES DRAWN AEROBIC AND ANAEROBIC Blood Culture adequate volume   Culture   Final    NO GROWTH 2 DAYS Performed at [facility], [address] [place], [place] [address]    Report Status PENDING  Incomplete  Blood Culture (routine x 2)     Status: None (Preliminary result)   Collection Time: 01/16/19 11:25 AM   Specimen:  BLOOD  Result Value Ref Range Status   Specimen Description BLOOD RIGHT ANTECUBITAL  Final   Special Requests   Final    BOTTLES DRAWN AEROBIC AND ANAEROBIC Blood Culture adequate volume   Culture   Final    NO GROWTH 2 DAYS Performed at [facility], [address] [place], [place] [address]    Report Status PENDING  Incomplete  SARS Coronavirus 2 [facility](Hospital order, Performed in [facility] hospital lab)     Status: Abnormal   Collection Time: 01/16/19 11:50 AM   Specimen: Nasopharyngeal Swab  Result Value Ref Range Status   SARS Coronavirus 2 POSITIVE (A) NEGATIVE Final    Comment: RESULT CALLED TO, READ BACK BY AND VERIFIED WITH: Bedelia Person. Hardy RN 13:15 01/16/19 ([email or web address]) (NOTE) If result is NEGATIVE SARS-CoV-2 target nucleic acids are NOT DETECTED. The SARS-CoV-2 RNA is generally detectable in upper and lower  respiratory specimens during the acute phase of infection. The lowest  concentration of SARS-CoV-2 viral copies this assay can detect is 250  copies / mL. A negative result does not preclude SARS-CoV-2 infection  and should not be used as the sole basis for treatment or other  patient management decisions.  A negative result may occur with  improper specimen collection / handling, submission of specimen other  than nasopharyngeal swab, presence of viral mutation(s) within  the  areas targeted by this assay, and inadequate number of viral copies  (<250 copies / mL). A negative result must be combined with clinical  observations, patient history, and epidemiological information. If result is POSITIVE SARS-CoV-2 target nucleic acids are DETECTED. Th e SARS-CoV-2 RNA is generally detectable in upper and lower  respiratory specimens during the acute phase of infection.  Positive  results are indicative of active infection with SARS-CoV-2.  Clinical  correlation with patient history and other diagnostic information is  necessary to determine patient infection status.  Positive results do  not rule out bacterial infection or co-infection with other viruses. If result is PRESUMPTIVE POSTIVE SARS-CoV-2 nucleic acids MAY BE PRESENT.   A presumptive positive result was obtained on the submitted specimen  and confirmed on repeat testing.  While 2019 novel coronavirus  (SARS-CoV-2) nucleic acids may be present in the submitted sample  additional confirmatory testing may be necessary for epidemiological  and / or clinical management purposes  to differentiate between  SARS-CoV-2 and other Sarbecovirus currently known to infect humans.  If clinically indicated additional testing with an alternate test  methodology [phone]([id number]) is  advised. The SARS-CoV-2 RNA is generally  detectable in upper and lower respiratory specimens during the acute  phase of infection. The expected result is Negative. Fact Sheet for Patients:  BoilerBrush.com.cyhttps://www.fda.gov/media/136312/download Fact Sheet for Healthcare Providers: https://pope.com/https://www.fda.gov/media/136313/download This test is not yet approved or cleared by the [place] FDA and has been authorized for detection and/or diagnosis of SARS-CoV-2 by FDA under an Emergency Use Authorization (EUA).  This EUA will remain in effect (meaning this test can be used) for the duration of the COVID-19 declaration under Section 564(b)(1) of the Act, 21  U.S.C. section 360bbb-3(b)(1), unless the authorization is terminated or revoked sooner. Performed at [facility], [address] [place], [place] [address]   MRSA PCR Screening     Status: None   Collection Time: 01/16/19  6:18 PM   Specimen: Nasal Mucosa; Nasopharyngeal  Result Value Ref Range Status   MRSA by PCR NEGATIVE NEGATIVE Final    Comment:  The GeneXpert MRSA Assay (FDA approved for NASAL specimens only), is one component of a comprehensive MRSA colonization surveillance program. It is not intended to diagnose MRSA infection nor to guide or monitor treatment for MRSA infections. Performed at [facility], [address] [place], [place] [address]       Radiology Studies: No results found.   Pamella Pertostin Gherghe, MD, PhD [facility]  Contact via  [email or web address]  [facility] Office Info P: [phone] F: [phone]

## 2019-01-20 LAB — COMPREHENSIVE METABOLIC PANEL
ALT: 55 U/L — ABNORMAL HIGH (ref 0–44)
AST: 24 U/L (ref 15–41)
Albumin: 3.1 g/dL — ABNORMAL LOW (ref 3.5–5.0)
Alkaline Phosphatase: 75 U/L (ref 38–126)
Anion gap: 8 (ref 5–15)
BUN: 23 mg/dL — ABNORMAL HIGH (ref 6–20)
CO2: 29 mmol/L (ref 22–32)
Calcium: 8.7 mg/dL — ABNORMAL LOW (ref 8.9–10.3)
Chloride: 100 mmol/L (ref 98–111)
Creatinine, Ser: 0.68 mg/dL (ref 0.61–1.24)
GFR calc Af Amer: >60 mL/min (ref >60)
GFR calc non Af Amer: >60 mL/min (ref >60)
Glucose, Bld: 189 mg/dL — ABNORMAL HIGH (ref 70–99)
Potassium: 4 mmol/L (ref 3.5–5.1)
Sodium: 137 mmol/L (ref 135–145)
Total Bilirubin: 0.2 mg/dL — ABNORMAL LOW (ref 0.3–1.2)
Total Protein: 6.3 g/dL — ABNORMAL LOW (ref 6.5–8.1)

## 2019-01-20 LAB — CBC WITH DIFFERENTIAL/PLATELET
Abs Immature Granulocytes: 0.07 10*3/uL (ref 0.00–0.07)
Basophils Absolute: 0 10*3/uL (ref 0.0–0.1)
Basophils Relative: 0 %
Eosinophils Absolute: 0 10*3/uL (ref 0.0–0.5)
Eosinophils Relative: 0 %
HCT: 42.1 % (ref 39.0–52.0)
Hemoglobin: 13.9 g/dL (ref 13.0–17.0)
Immature Granulocytes: 1 %
Lymphocytes Relative: 6 %
Lymphs Abs: 0.5 10*3/uL — ABNORMAL LOW (ref 0.7–4.0)
MCH: 30.9 pg (ref 26.0–34.0)
MCHC: 33 g/dL (ref 30.0–36.0)
MCV: 93.6 fL (ref 80.0–100.0)
Monocytes Absolute: 0.3 10*3/uL (ref 0.1–1.0)
Monocytes Relative: 4 %
Neutro Abs: 7.3 10*3/uL (ref 1.7–7.7)
Neutrophils Relative %: 89 %
Platelets: 556 10*3/uL — ABNORMAL HIGH (ref 150–400)
RBC: 4.5 MIL/uL (ref 4.22–5.81)
RDW: 12 % (ref 11.5–15.5)
WBC: 8.2 10*3/uL (ref 4.0–10.5)
nRBC: 0 % (ref 0.0–0.2)

## 2019-01-20 LAB — GLUCOSE, CAPILLARY
Glucose-Capillary: 152 mg/dL — ABNORMAL HIGH (ref 70–99)
Glucose-Capillary: 205 mg/dL — ABNORMAL HIGH (ref 70–99)
Glucose-Capillary: 206 mg/dL — ABNORMAL HIGH (ref 70–99)
Glucose-Capillary: 320 mg/dL — ABNORMAL HIGH (ref 70–99)
Glucose-Capillary: 331 mg/dL — ABNORMAL HIGH (ref 70–99)

## 2019-01-20 LAB — D-DIMER, QUANTITATIVE: D-Dimer, Quant: 0.36 ug/mL-FEU (ref 0.00–0.50)

## 2019-01-20 LAB — PREPARE FRESH FROZEN PLASMA: Unit division: 0

## 2019-01-20 LAB — BPAM FFP
Blood Product Expiration Date: 202007111830
ISSUE DATE / TIME: 202007102301
Unit Type and Rh: 6200

## 2019-01-20 LAB — C-REACTIVE PROTEIN: CRP: 1.9 mg/dL — ABNORMAL HIGH (ref <1.0)

## 2019-01-20 LAB — MAGNESIUM: Magnesium: 2.1 mg/dL (ref 1.7–2.4)

## 2019-01-20 LAB — FERRITIN: Ferritin: 452 ng/mL — ABNORMAL HIGH (ref 24–336)

## 2019-01-20 MED ORDER — SALINE SPRAY 0.65 % NA SOLN
1.0000 | NASAL | Status: DC | PRN
  Filled 2019-01-20: qty 44

## 2019-01-20 NOTE — Progress Notes (Signed)
PROGRESS NOTE  Freeman Edward JollySilva RUE:[id number] DOB: 07/17/1970 DOA: 01/16/2019 PCP: Patient, No Pcp Per   LOS: 4 days   Brief Narrative / Interim history: 48 year old male without significant past medical history, comes to the hospital and is admitted on 01/16/2019 with 3 to 4 days of fever, cough, shortness of breath, symptoms progressively worsening and decided to seek care.  He was initially admitted to the ICU due to high oxygen needs.  He has been stable in the ICU, and transferred to the floor on 01/17/2019  Subjective: Use of [employer] interpreter for this encounter.  He is sitting in the chair feeling a lot better.  Denies any chest pain.  Still complains of shortness of breath with ambulation.  Oxygen requirements went from 15 L high flow to 7 L high flow this morning  Assessment & Plan: Principal Problem:   Acute respiratory disease due to COVID-19 virus Active Problems:   Hypoxia   Principal Problem Acute Hypoxic Respiratory Failure due to Covid-19 Viral Illness -Some improvement in his respiratory status over the last 24 hours, went from 15 L high flow nasal cannula yesterday to 7 L today.  Subjectively feels better as well -Overall slow progress, continue steroids, continue to closely monitor -Prone as able, incentive spirometry  COVID-19 Labs  Recent Labs    01/18/19 0230 01/19/19 0315 01/20/19 0413  DDIMER 0.40 0.47 0.36  FERRITIN 440* 445* 452*  CRP 7.9* 4.0* 1.9*    Lab Results  Component Value Date   SARSCOV2NAA POSITIVE (A) 01/16/2019   Fever:  Afebrile Oxygen requirements:  7 L high flow Antibiotics: No antibacterials Remdesivir:  7/8, day 5 Steroids:  Solu-Medrol 7/8, day 5 Diuretics:  None Actemra:  2 doses, 7/8, 7/9 Convalescent Plasma: 7/10 Vitamin C and Zinc:  Yes  Active Problems Hyperglycemia -Hemoglobin A1c 10.7, CBGs fairly stable 150-1 90  Morbid obesity -Will benefit from weight loss  Scheduled Meds: . Chlorhexidine Gluconate Cloth   6 each Topical Q0600  . enoxaparin (LOVENOX) injection  0.5 mg/kg Subcutaneous Q12H  . famotidine  20 mg Oral BID  . insulin aspart  0-20 Units Subcutaneous Q4H  . methylPREDNISolone (SOLU-MEDROL) injection  60 mg Intravenous Q12H  . vitamin C  500 mg Oral Daily  . zinc sulfate  220 mg Oral Daily   Continuous Infusions: . remdesivir 100 mg in NS 250 mL 100 mg (01/19/19 1900)   PRN Meds:.acetaminophen, ondansetron **OR** ondansetron (ZOFRAN) IV, polyethylene glycol  DVT prophylaxis: Lovenox Code Status: Full code Family Communication: d/w patient  Disposition Plan: home when ready   Consultants:   PCCM   Procedures:   None   Antimicrobials:  None    Objective: Vitals:   01/19/19 1537 01/19/19 1635 01/19/19 2046 01/20/19 0720  BP: 123/82  124/72 104/72  Pulse: 65 (!) 53 (!) 56 (!) 56  Resp: 19 20 (!) 21 20  Temp: 98.2 F (36.8 C)  98.6 F (37 C) 98 F (36.7 C)  TempSrc: Oral  Oral Oral  SpO2: 93% 96% 91% 94%  Weight:      Height:        Intake/Output Summary (Last 24 hours) at 01/20/2019 1111 Last data filed at 01/20/2019 0300 Gross per 24 hour  Intake 250 ml  Output 1000 ml  Net -750 ml   Filed Weights   01/16/19 1600 01/16/19 1810  Weight: 117.9 kg 121.9 kg    Examination:  Constitutional: Sitting in chair, breathing fast but appears comfortable Eyes: No icterus seen ENMT:  Moist mucous membranes Respiratory: Bibasilar rhonchi, no wheezing, slightly increased respiratory effort, no crackles Cardiovascular: Regular rate and rhythm, no murmurs.  No peripheral edema Abdomen: Soft, NT, ND, positive bowel sounds Musculoskeletal: no clubbing / cyanosis.  Skin: No rashes seen Neurologic: Nonfocal, equal strength  Data Reviewed: I have independently reviewed following labs  CBC: Recent Labs  Lab 01/16/19 1150 01/16/19 1206 01/17/19 0606 01/18/19 0230 01/19/19 0315 01/20/19 0413  WBC 8.2  --  3.3* 8.0 11.9* 8.2  NEUTROABS 7.1  --  2.6 7.0 9.7* 7.3   HGB 15.4 15.3 15.7 14.9 13.8 13.9  HCT 44.3 45.0 46.8 44.5 42.1 42.1  MCV 92.3  --  93.4 94.3 94.8 93.6  PLT 406*  --  359 501* 599* 188*   Basic Metabolic Panel: Recent Labs  Lab 01/16/19 1150 01/16/19 1206 01/17/19 0606 01/18/19 0230 01/19/19 0315 01/20/19 0413  NA 138 138 139 138 142 137  K 3.6 3.6 4.1 3.9 3.3* 4.0  CL 102  --  103 101 105 100  CO2 23  --  22 23 26 29   GLUCOSE 175*  --  247* 154* 59* 189*  BUN 10  --  21* 25* 31* 23*  CREATININE 0.69  --  0.53* 0.52* 0.62 0.68  CALCIUM 8.8*  --  9.0 8.7* 8.8* 8.7*  MG  --   --  2.3 2.2 2.2 2.1   GFR: Estimated Creatinine Clearance: 152.7 mL/min (by C-G formula based on SCr of 0.68 mg/dL). Liver Function Tests: Recent Labs  Lab 01/16/19 1150 01/17/19 0606 01/18/19 0230 01/19/19 0315 01/20/19 0413  AST 32 38 27 27 24   ALT 43 53* 54* 46* 55*  ALKPHOS 82 82 76 79 75  BILITOT 0.7 0.3 0.5 0.3 0.2*  PROT 7.4 7.5 6.9 6.7 6.3*  ALBUMIN 3.2* 3.3* 3.0* 3.2* 3.1*   No results for input(s): LIPASE, AMYLASE in the last 168 hours. No results for input(s): AMMONIA in the last 168 hours. Coagulation Profile: No results for input(s): INR, PROTIME in the last 168 hours. Cardiac Enzymes: No results for input(s): CKTOTAL, CKMB, CKMBINDEX, TROPONINI in the last 168 hours. BNP (last 3 results) No results for input(s): PROBNP in the last 8760 hours. HbA1C: Recent Labs    01/18/19 0230  HGBA1C 10.7*   CBG: Recent Labs  Lab 01/19/19 1614 01/19/19 2013 01/19/19 2349 01/20/19 0403 01/20/19 0719  GLUCAP 381* 328* 205* 206* 152*   Lipid Profile: No results for input(s): CHOL, HDL, LDLCALC, TRIG, CHOLHDL, LDLDIRECT in the last 72 hours. Thyroid Function Tests: No results for input(s): TSH, T4TOTAL, FREET4, T3FREE, THYROIDAB in the last 72 hours. Anemia Panel: Recent Labs    01/19/19 0315 01/20/19 0413  FERRITIN 445* 452*   Urine analysis: No results found for: COLORURINE, APPEARANCEUR, LABSPEC, PHURINE, GLUCOSEU,  HGBUR, BILIRUBINUR, KETONESUR, PROTEINUR, UROBILINOGEN, NITRITE, LEUKOCYTESUR Sepsis Labs: Invalid input(s): PROCALCITONIN, LACTICIDVEN  Recent Results (from the past 240 hour(s))  Blood Culture (routine x 2)     Status: None (Preliminary result)   Collection Time: 01/16/19 11:25 AM   Specimen: BLOOD  Result Value Ref Range Status   Specimen Description BLOOD LEFT ANTECUBITAL  Final   Special Requests   Final    BOTTLES DRAWN AEROBIC AND ANAEROBIC Blood Culture adequate volume   Culture   Final    NO GROWTH 3 DAYS Performed at [facility], [address], [place], [place] [address]    Report Status PENDING  Incomplete  Blood Culture (routine x 2)  Status: None (Preliminary result)   Collection Time: 01/16/19 11:25 AM   Specimen: BLOOD  Result Value Ref Range Status   Specimen Description BLOOD RIGHT ANTECUBITAL  Final   Special Requests   Final    BOTTLES DRAWN AEROBIC AND ANAEROBIC Blood Culture adequate volume   Culture   Final    NO GROWTH 3 DAYS Performed at [facility], [address] [place], [place] [address]    Report Status PENDING  Incomplete  SARS Coronavirus 2 [facility](Hospital order, Performed in [facility] lab)     Status: Abnormal   Collection Time: 01/16/19 11:50 AM   Specimen: Nasopharyngeal Swab  Result Value Ref Range Status   SARS Coronavirus 2 POSITIVE (A) NEGATIVE Final    Comment: RESULT CALLED TO, READ BACK BY AND VERIFIED WITH: Bedelia Person. Hardy RN 13:15 01/16/19 ([email or web address]) (NOTE) If result is NEGATIVE SARS-CoV-2 target nucleic acids are NOT DETECTED. The SARS-CoV-2 RNA is generally detectable in upper and lower  respiratory specimens during the acute phase of infection. The lowest  concentration of SARS-CoV-2 viral copies this assay can detect is 250  copies / mL. A negative result does not preclude SARS-CoV-2 infection  and should not be used as the sole basis for treatment or other  patient management decisions.  A negative result may  occur with  improper specimen collection / handling, submission of specimen other  than nasopharyngeal swab, presence of viral mutation(s) within the  areas targeted by this assay, and inadequate number of viral copies  (<250 copies / mL). A negative result must be combined with clinical  observations, patient history, and epidemiological information. If result is POSITIVE SARS-CoV-2 target nucleic acids are DETECTED. Th e SARS-CoV-2 RNA is generally detectable in upper and lower  respiratory specimens during the acute phase of infection.  Positive  results are indicative of active infection with SARS-CoV-2.  Clinical  correlation with patient history and other diagnostic information is  necessary to determine patient infection status.  Positive results do  not rule out bacterial infection or co-infection with other viruses. If result is PRESUMPTIVE POSTIVE SARS-CoV-2 nucleic acids MAY BE PRESENT.   A presumptive positive result was obtained on the submitted specimen  and confirmed on repeat testing.  While 2019 novel coronavirus  (SARS-CoV-2) nucleic acids may be present in the submitted sample  additional confirmatory testing may be necessary for epidemiological  and / or clinical management purposes  to differentiate between  SARS-CoV-2 and other Sarbecovirus currently known to infect humans.  If clinically indicated additional testing with an alternate test  methodology [phone]([id number]) is  advised. The SARS-CoV-2 RNA is generally  detectable in upper and lower respiratory specimens during the acute  phase of infection. The expected result is Negative. Fact Sheet for Patients:  BoilerBrush.com.cyhttps://www.fda.gov/media/136312/download Fact Sheet for Healthcare Providers: https://pope.com/https://www.fda.gov/media/136313/download This test is not yet approved or cleared by the [place] FDA and has been authorized for detection and/or diagnosis of SARS-CoV-2 by FDA under an Emergency Use Authorization (EUA).  This  EUA will remain in effect (meaning this test can be used) for the duration of the COVID-19 declaration under Section 564(b)(1) of the Act, 21 U.S.C. section 360bbb-3(b)(1), unless the authorization is terminated or revoked sooner. Performed at [facility], [address] [place], [place] [address]   MRSA PCR Screening     Status: None   Collection Time: 01/16/19  6:18 PM   Specimen: Nasal Mucosa; Nasopharyngeal  Result Value Ref Range Status  MRSA by PCR NEGATIVE NEGATIVE Final    Comment:        The GeneXpert MRSA Assay (FDA approved for NASAL specimens only), is one component of a comprehensive MRSA colonization surveillance program. It is not intended to diagnose MRSA infection nor to guide or monitor treatment for MRSA infections. Performed at [facility], [address] [place], [place] [address]       Radiology Studies: No results found.   Pamella Pertostin Gherghe, MD, PhD [facility]  Contact via  [email or web address]  [facility] Office Info P: [phone] F: [phone]

## 2019-01-21 LAB — CBC WITH DIFFERENTIAL/PLATELET
Abs Immature Granulocytes: 0.16 10*3/uL — ABNORMAL HIGH (ref 0.00–0.07)
Basophils Absolute: 0 10*3/uL (ref 0.0–0.1)
Basophils Relative: 0 %
Eosinophils Absolute: 0 10*3/uL (ref 0.0–0.5)
Eosinophils Relative: 0 %
HCT: 44.6 % (ref 39.0–52.0)
Hemoglobin: 15.2 g/dL (ref 13.0–17.0)
Immature Granulocytes: 2 %
Lymphocytes Relative: 6 %
Lymphs Abs: 0.5 10*3/uL — ABNORMAL LOW (ref 0.7–4.0)
MCH: 31.5 pg (ref 26.0–34.0)
MCHC: 34.1 g/dL (ref 30.0–36.0)
MCV: 92.3 fL (ref 80.0–100.0)
Monocytes Absolute: 0.3 10*3/uL (ref 0.1–1.0)
Monocytes Relative: 3 %
Neutro Abs: 7.9 10*3/uL — ABNORMAL HIGH (ref 1.7–7.7)
Neutrophils Relative %: 89 %
Platelets: 552 10*3/uL — ABNORMAL HIGH (ref 150–400)
RBC: 4.83 MIL/uL (ref 4.22–5.81)
RDW: 11.9 % (ref 11.5–15.5)
WBC: 8.9 10*3/uL (ref 4.0–10.5)
nRBC: 0 % (ref 0.0–0.2)

## 2019-01-21 LAB — COMPREHENSIVE METABOLIC PANEL
ALT: 60 U/L — ABNORMAL HIGH (ref 0–44)
AST: 26 U/L (ref 15–41)
Albumin: 3.1 g/dL — ABNORMAL LOW (ref 3.5–5.0)
Alkaline Phosphatase: 83 U/L (ref 38–126)
Anion gap: 8 (ref 5–15)
BUN: 19 mg/dL (ref 6–20)
CO2: 29 mmol/L (ref 22–32)
Calcium: 8.8 mg/dL — ABNORMAL LOW (ref 8.9–10.3)
Chloride: 101 mmol/L (ref 98–111)
Creatinine, Ser: 0.59 mg/dL — ABNORMAL LOW (ref 0.61–1.24)
GFR calc Af Amer: >60 mL/min (ref >60)
GFR calc non Af Amer: >60 mL/min (ref >60)
Glucose, Bld: 170 mg/dL — ABNORMAL HIGH (ref 70–99)
Potassium: 4.3 mmol/L (ref 3.5–5.1)
Sodium: 138 mmol/L (ref 135–145)
Total Bilirubin: 0.6 mg/dL (ref 0.3–1.2)
Total Protein: 6.4 g/dL — ABNORMAL LOW (ref 6.5–8.1)

## 2019-01-21 LAB — CULTURE, BLOOD (ROUTINE X 2)
Culture: NO GROWTH
Culture: NO GROWTH
Special Requests: ADEQUATE
Special Requests: ADEQUATE

## 2019-01-21 LAB — GLUCOSE, CAPILLARY
Glucose-Capillary: 124 mg/dL — ABNORMAL HIGH (ref 70–99)
Glucose-Capillary: 171 mg/dL — ABNORMAL HIGH (ref 70–99)
Glucose-Capillary: 186 mg/dL — ABNORMAL HIGH (ref 70–99)
Glucose-Capillary: 222 mg/dL — ABNORMAL HIGH (ref 70–99)
Glucose-Capillary: 222 mg/dL — ABNORMAL HIGH (ref 70–99)
Glucose-Capillary: 328 mg/dL — ABNORMAL HIGH (ref 70–99)
Glucose-Capillary: 342 mg/dL — ABNORMAL HIGH (ref 70–99)
Glucose-Capillary: 393 mg/dL — ABNORMAL HIGH (ref 70–99)
Glucose-Capillary: 413 mg/dL — ABNORMAL HIGH (ref 70–99)

## 2019-01-21 LAB — LACTATE DEHYDROGENASE: LDH: 201 U/L — ABNORMAL HIGH (ref 98–192)

## 2019-01-21 LAB — MAGNESIUM: Magnesium: 2.3 mg/dL (ref 1.7–2.4)

## 2019-01-21 LAB — C-REACTIVE PROTEIN: CRP: 1.2 mg/dL — ABNORMAL HIGH (ref <1.0)

## 2019-01-21 LAB — D-DIMER, QUANTITATIVE: D-Dimer, Quant: 0.39 ug/mL-FEU (ref 0.00–0.50)

## 2019-01-21 LAB — FERRITIN: Ferritin: 475 ng/mL — ABNORMAL HIGH (ref 24–336)

## 2019-01-21 MED ORDER — INSULIN ASPART 100 UNIT/ML ~~LOC~~ SOLN
7.0000 [IU] | Freq: Three times a day (TID) | SUBCUTANEOUS | Status: DC
  Administered 2019-01-22 – 2019-01-23 (×4): 7 [IU] via SUBCUTANEOUS

## 2019-01-21 MED ORDER — INSULIN STARTER KIT- SYRINGES (SPANISH)
1.0000 | Freq: Once | Status: AC
  Administered 2019-01-21: 1
  Filled 2019-01-21: qty 1

## 2019-01-21 MED ORDER — INSULIN GLARGINE 100 UNIT/ML ~~LOC~~ SOLN
10.0000 [IU] | SUBCUTANEOUS | Status: DC
  Administered 2019-01-21 – 2019-01-22 (×2): 10 [IU] via SUBCUTANEOUS
  Filled 2019-01-21 (×3): qty 0.1

## 2019-01-21 MED ORDER — LIVING WELL WITH DIABETES BOOK - IN SPANISH
Freq: Once | Status: AC
  Administered 2019-01-21: 16:00:00
  Filled 2019-01-21: qty 1

## 2019-01-21 MED ORDER — INSULIN ASPART 100 UNIT/ML ~~LOC~~ SOLN
7.0000 [IU] | Freq: Once | SUBCUTANEOUS | Status: AC
  Administered 2019-01-21: 7 [IU] via SUBCUTANEOUS

## 2019-01-21 NOTE — Care Management (Addendum)
Hospital followup televisit appointment arranged with: [facility] on February 04, 2019 @ 1430; AVS updated.    ADDENDUM: 01/23/19 @ 1457-Manette Doto RNCM-CM consult acknowledged for medication assistance. Patient will transition home on Glipizide and Metformin. CM priced each medication, with a 30-day supply $4; $10 for a 90-day supply on the generic Rx list.    Midge Minium RN, BSN, NCM-BC, ACM-RN [phone]

## 2019-01-21 NOTE — Progress Notes (Signed)
Nutrition Consult for Diet Education  RD working remotely.  RD consulted for nutrition education regarding new onset diabetes. Called into patient's room twice by [facility] # [phone] to speak with patient. Patient did not answer.  Lab Results  Component Value Date   HGBA1C 10.7 (H) 01/18/2019    RD mailed handout "Carbohydrate Counting for People with Diabetes" from the Academy of Nutrition and Dietetics to patient's address.   Body mass index is 38.56 kg/m. Pt meets criteria for obesity based on current BMI.  Current diet order is regular. Labs and medications reviewed. No further nutrition interventions warranted at this time. RD contact information provided. If additional nutrition issues arise, please re-consult RD.   Molli Barrows, RD, LDN, [place] Pager [phone] After Hours Pager [phone]

## 2019-01-21 NOTE — Progress Notes (Signed)
Insulin starter kit given to patient. Education on how to inject self done with the help of interpreter services. He verbalized understanding.Patient was able to inject himself twice. He had no questions.

## 2019-01-21 NOTE — Progress Notes (Signed)
Inpatient Diabetes Program Recommendations  AACE/ADA: New Consensus Statement on Inpatient Glycemic Control (2015)  Target Ranges:  Prepandial:   less than 140 mg/dL      Peak postprandial:   less than 180 mg/dL (1-2 hours)      Critically ill patients:  140 - 180 mg/dL   Lab Results  Component Value Date   GLUCAP 328 (H) 01/21/2019   HGBA1C 10.7 (H) 01/18/2019    Review of Glycemic Control Results for RANDALL, COLDEN (MRN [id number]) as of 01/21/2019 12:00  Ref. Range 01/20/2019 20:38 01/21/2019 00:20 01/21/2019 04:01 01/21/2019 07:54 01/21/2019 11:32  Glucose-Capillary Latest Ref Range: 70 - 99 mg/dL 342 (H) 171 (H) 186 (H) 124 (H) 328 (H)   Admit with: COVID 19  NO History of Diabetes noted  Current Orders: Novolog Resistant Correction Scale/ SSI (0-20 units) Q4 hours  Inpatient Diabetes Program Recommendations:    Has received total of 38 units Novolog over the past 24 hrs. Please consider: -Lantus 18 units Daily (0.15 units/kg dosing) -Novolog 4 units tid meal coverage if eating 50% -Change Novolog correction to Moderate tid + hs 0-5 units since patient is now eating. Secure chat sent to Dr. Cruzita Lederer with recommendations.  Ordered Living Well With Diabetes & Insulin starter kit syringes to prepare if patient is discharged home on insulin. Nurses, please do insulin administration teaching and allow patient to give injections as appropriate.  A1c is 10.7 Will speak to patient regarding diabetes management.  Thank you, Nani Gasser. Shanavia Makela, RN, MSN, CDE  Diabetes Coordinator Inpatient Glycemic Control Team Team Pager [phone] (8am-5pm) 01/21/2019 12:03 PM

## 2019-01-21 NOTE — Progress Notes (Signed)
PROGRESS NOTE  Terry Cole ZOX:[id number] DOB: 12/19/1970 DOA: 01/16/2019 PCP: Patient, No Pcp Per   LOS: 5 days   Brief Narrative / Interim history: 48 year old male without significant past medical history, comes to the hospital and is admitted on 01/16/2019 with 3 to 4 days of fever, cough, shortness of breath, symptoms progressively worsening and decided to seek care.  He was initially admitted to the ICU due to high oxygen needs.  He has been stable in the ICU, and transferred to the floor on 01/17/2019  Subjective: Use of [employer] interpreter for this encounter.  Continues to appreciates improvement and remains on 7 L high flow.  No chest pain, no abdominal pain, no nausea or vomiting  Assessment & Plan: Principal Problem:   Acute respiratory disease due to COVID-19 virus Active Problems:   Hypoxia   Principal Problem Acute Hypoxic Respiratory Failure due to Covid-19 Viral Illness -Some improvement in his respiratory status over the last 24 hours, went from 15 L high flow nasal cannula yesterday to 7 L today.  Subjectively feels better as well -Slow progress, continue steroids, finished Remdesivir  COVID-19 Labs  Recent Labs    01/19/19 0315 01/20/19 0413 01/21/19 0353 01/21/19 0355  DDIMER 0.47 0.36  --  0.39  FERRITIN 445* 452* 475*  --   LDH  --   --   --  201*  CRP 4.0* 1.9* 1.2*  --     Lab Results  Component Value Date   SARSCOV2NAA POSITIVE (A) 01/16/2019   Fever:  Afebrile Oxygen requirements:  7 L high flow Antibiotics: No antibacterials Remdesivir:  Finished a 5-day course Steroids:  Solu-Medrol 7/8, day 6 Diuretics:  None Actemra:  2 doses, 7/8, 7/9 Convalescent Plasma: 7/10 Vitamin C and Zinc:  Yes  Active Problems Hyperglycemia -Hemoglobin A1c 10.7, CBGs stable  CBG (last 3)  Recent Labs    01/21/19 0020 01/21/19 0401 01/21/19 0754  GLUCAP 171* 186* 124*    Morbid obesity -Will benefit from weight loss  Scheduled Meds: .  Chlorhexidine Gluconate Cloth  6 each Topical Q0600  . enoxaparin (LOVENOX) injection  0.5 mg/kg Subcutaneous Q12H  . famotidine  20 mg Oral BID  . insulin aspart  0-20 Units Subcutaneous Q4H  . methylPREDNISolone (SOLU-MEDROL) injection  60 mg Intravenous Q12H  . vitamin C  500 mg Oral Daily  . zinc sulfate  220 mg Oral Daily   Continuous Infusions:  PRN Meds:.acetaminophen, ondansetron **OR** ondansetron (ZOFRAN) IV, polyethylene glycol, sodium chloride  DVT prophylaxis: Lovenox Code Status: Full code Family Communication: d/w patient  Disposition Plan: home when ready   Consultants:   PCCM   Procedures:   None   Antimicrobials:  None    Objective: Vitals:   01/20/19 1520 01/20/19 1943 01/21/19 0403 01/21/19 0750  BP: 112/80 (!) 141/76 117/82 112/74  Pulse: 65 (!) 59  62  Resp: 20 (!) 24  20  Temp: 98.1 F (36.7 C) 98.8 F (37.1 C) (!) 97.4 F (36.3 C) 97.8 F (36.6 C)  TempSrc: Oral Oral Oral Oral  SpO2: 91% 94%  94%  Weight:      Height:        Intake/Output Summary (Last 24 hours) at 01/21/2019 1054 Last data filed at 01/21/2019 0759 Gross per 24 hour  Intake -  Output 3900 ml  Net -3900 ml   Filed Weights   01/16/19 1600 01/16/19 1810  Weight: 117.9 kg 121.9 kg    Examination:  Constitutional: Laying in bed, eating  breakfast Eyes: No scleral icterus ENMT: Moist mucous membranes Respiratory: Bibasilar rhonchi, no wheezing, no crackles, moves air well Cardiovascular: Regular rate and rhythm, no murmurs appreciated.  No peripheral edema Abdomen: Soft, nontender, nondistended, positive bowel sounds Musculoskeletal: no clubbing / cyanosis.  Skin: No rashes seen Neurologic: No focal deficits, equal strength  Data Reviewed: I have independently reviewed following labs  CBC: Recent Labs  Lab 01/17/19 0606 01/18/19 0230 01/19/19 0315 01/20/19 0413 01/21/19 0355  WBC 3.3* 8.0 11.9* 8.2 8.9  NEUTROABS 2.6 7.0 9.7* 7.3 7.9*  HGB 15.7 14.9 13.8  13.9 15.2  HCT 46.8 44.5 42.1 42.1 44.6  MCV 93.4 94.3 94.8 93.6 92.3  PLT 359 501* 599* 556* 552*   Basic Metabolic Panel: Recent Labs  Lab 01/17/19 0606 01/18/19 0230 01/19/19 0315 01/20/19 0413 01/21/19 0355  NA 139 138 142 137 138  K 4.1 3.9 3.3* 4.0 4.3  CL 103 101 105 100 101  CO2 22 23 26 29 29   GLUCOSE 247* 154* 59* 189* 170*  BUN 21* 25* 31* 23* 19  CREATININE 0.53* 0.52* 0.62 0.68 0.59*  CALCIUM 9.0 8.7* 8.8* 8.7* 8.8*  MG 2.3 2.2 2.2 2.1 2.3   GFR: Estimated Creatinine Clearance: 152.7 mL/min (A) (by C-G formula based on SCr of 0.59 mg/dL (L)). Liver Function Tests: Recent Labs  Lab 01/17/19 0606 01/18/19 0230 01/19/19 0315 01/20/19 0413 01/21/19 0355  AST 38 27 27 24 26   ALT 53* 54* 46* 55* 60*  ALKPHOS 82 76 79 75 83  BILITOT 0.3 0.5 0.3 0.2* 0.6  PROT 7.5 6.9 6.7 6.3* 6.4*  ALBUMIN 3.3* 3.0* 3.2* 3.1* 3.1*   No results for input(s): LIPASE, AMYLASE in the last 168 hours. No results for input(s): AMMONIA in the last 168 hours. Coagulation Profile: No results for input(s): INR, PROTIME in the last 168 hours. Cardiac Enzymes: No results for input(s): CKTOTAL, CKMB, CKMBINDEX, TROPONINI in the last 168 hours. BNP (last 3 results) No results for input(s): PROBNP in the last 8760 hours. HbA1C: No results for input(s): HGBA1C in the last 72 hours. CBG: Recent Labs  Lab 01/20/19 1519 01/20/19 2038 01/21/19 0020 01/21/19 0401 01/21/19 0754  GLUCAP 331* 342* 171* 186* 124*   Lipid Profile: No results for input(s): CHOL, HDL, LDLCALC, TRIG, CHOLHDL, LDLDIRECT in the last 72 hours. Thyroid Function Tests: No results for input(s): TSH, T4TOTAL, FREET4, T3FREE, THYROIDAB in the last 72 hours. Anemia Panel: Recent Labs    01/20/19 0413 01/21/19 0353  FERRITIN 452* 475*   Urine analysis: No results found for: COLORURINE, APPEARANCEUR, LABSPEC, PHURINE, GLUCOSEU, HGBUR, BILIRUBINUR, KETONESUR, PROTEINUR, UROBILINOGEN, NITRITE, LEUKOCYTESUR Sepsis  Labs: Invalid input(s): PROCALCITONIN, LACTICIDVEN  Recent Results (from the past 240 hour(s))  Blood Culture (routine x 2)     Status: None (Preliminary result)   Collection Time: 01/16/19 11:25 AM   Specimen: BLOOD  Result Value Ref Range Status   Specimen Description BLOOD LEFT ANTECUBITAL  Final   Special Requests   Final    BOTTLES DRAWN AEROBIC AND ANAEROBIC Blood Culture adequate volume   Culture   Final    NO GROWTH 4 DAYS Performed at [facility], [address] [place], [place] [address]    Report Status PENDING  Incomplete  Blood Culture (routine x 2)     Status: None (Preliminary result)   Collection Time: 01/16/19 11:25 AM   Specimen: BLOOD  Result Value Ref Range Status   Specimen Description BLOOD RIGHT ANTECUBITAL  Final  Special Requests   Final    BOTTLES DRAWN AEROBIC AND ANAEROBIC Blood Culture adequate volume   Culture   Final    NO GROWTH 4 DAYS Performed at [facility], [address] [place], [place] [address]    Report Status PENDING  Incomplete  SARS Coronavirus 2 [facility](Hospital order, Performed in [facility] hospital lab)     Status: Abnormal   Collection Time: 01/16/19 11:50 AM   Specimen: Nasopharyngeal Swab  Result Value Ref Range Status   SARS Coronavirus 2 POSITIVE (A) NEGATIVE Final    Comment: RESULT CALLED TO, READ BACK BY AND VERIFIED WITH: Bedelia Person. Hardy RN 13:15 01/16/19 ([email or web address]) (NOTE) If result is NEGATIVE SARS-CoV-2 target nucleic acids are NOT DETECTED. The SARS-CoV-2 RNA is generally detectable in upper and lower  respiratory specimens during the acute phase of infection. The lowest  concentration of SARS-CoV-2 viral copies this assay can detect is 250  copies / mL. A negative result does not preclude SARS-CoV-2 infection  and should not be used as the sole basis for treatment or other  patient management decisions.  A negative result may occur with  improper specimen collection / handling, submission of specimen other   than nasopharyngeal swab, presence of viral mutation(s) within the  areas targeted by this assay, and inadequate number of viral copies  (<250 copies / mL). A negative result must be combined with clinical  observations, patient history, and epidemiological information. If result is POSITIVE SARS-CoV-2 target nucleic acids are DETECTED. Th e SARS-CoV-2 RNA is generally detectable in upper and lower  respiratory specimens during the acute phase of infection.  Positive  results are indicative of active infection with SARS-CoV-2.  Clinical  correlation with patient history and other diagnostic information is  necessary to determine patient infection status.  Positive results do  not rule out bacterial infection or co-infection with other viruses. If result is PRESUMPTIVE POSTIVE SARS-CoV-2 nucleic acids MAY BE PRESENT.   A presumptive positive result was obtained on the submitted specimen  and confirmed on repeat testing.  While 2019 novel coronavirus  (SARS-CoV-2) nucleic acids may be present in the submitted sample  additional confirmatory testing may be necessary for epidemiological  and / or clinical management purposes  to differentiate between  SARS-CoV-2 and other Sarbecovirus currently known to infect humans.  If clinically indicated additional testing with an alternate test  methodology [phone]([id number]) is  advised. The SARS-CoV-2 RNA is generally  detectable in upper and lower respiratory specimens during the acute  phase of infection. The expected result is Negative. Fact Sheet for Patients:  BoilerBrush.com.cyhttps://www.fda.gov/media/136312/download Fact Sheet for Healthcare Providers: https://pope.com/https://www.fda.gov/media/136313/download This test is not yet approved or cleared by the [place] FDA and has been authorized for detection and/or diagnosis of SARS-CoV-2 by FDA under an Emergency Use Authorization (EUA).  This EUA will remain in effect (meaning this test can be used) for the duration of the  COVID-19 declaration under Section 564(b)(1) of the Act, 21 U.S.C. section 360bbb-3(b)(1), unless the authorization is terminated or revoked sooner. Performed at [facility], [address] [place], [place] [address]   MRSA PCR Screening     Status: None   Collection Time: 01/16/19  6:18 PM   Specimen: Nasal Mucosa; Nasopharyngeal  Result Value Ref Range Status   MRSA by PCR NEGATIVE NEGATIVE Final    Comment:        The GeneXpert MRSA Assay (FDA approved for NASAL specimens only), is one component of  a comprehensive MRSA colonization surveillance program. It is not intended to diagnose MRSA infection nor to guide or monitor treatment for MRSA infections. Performed at [facility], [address] [place], [place] [address]       Radiology Studies: No results found.   Pamella Pertostin Ashanta Amoroso, MD, PhD [facility]  Contact via  [email or web address]  [facility] Office Info P: [phone] F: [phone]

## 2019-01-22 LAB — CBC
HCT: 47.2 % (ref 39.0–52.0)
Hemoglobin: 15.6 g/dL (ref 13.0–17.0)
MCH: 30.6 pg (ref 26.0–34.0)
MCHC: 33.1 g/dL (ref 30.0–36.0)
MCV: 92.7 fL (ref 80.0–100.0)
Platelets: 611 10*3/uL — ABNORMAL HIGH (ref 150–400)
RBC: 5.09 MIL/uL (ref 4.22–5.81)
RDW: 11.9 % (ref 11.5–15.5)
WBC: 10.1 10*3/uL (ref 4.0–10.5)
nRBC: 0 % (ref 0.0–0.2)

## 2019-01-22 LAB — COMPREHENSIVE METABOLIC PANEL
ALT: 58 U/L — ABNORMAL HIGH (ref 0–44)
AST: 20 U/L (ref 15–41)
Albumin: 3.4 g/dL — ABNORMAL LOW (ref 3.5–5.0)
Alkaline Phosphatase: 93 U/L (ref 38–126)
Anion gap: 10 (ref 5–15)
BUN: 22 mg/dL — ABNORMAL HIGH (ref 6–20)
CO2: 30 mmol/L (ref 22–32)
Calcium: 8.8 mg/dL — ABNORMAL LOW (ref 8.9–10.3)
Chloride: 97 mmol/L — ABNORMAL LOW (ref 98–111)
Creatinine, Ser: 0.54 mg/dL — ABNORMAL LOW (ref 0.61–1.24)
GFR calc Af Amer: >60 mL/min (ref >60)
GFR calc non Af Amer: >60 mL/min (ref >60)
Glucose, Bld: 172 mg/dL — ABNORMAL HIGH (ref 70–99)
Potassium: 4.4 mmol/L (ref 3.5–5.1)
Sodium: 137 mmol/L (ref 135–145)
Total Bilirubin: 0.4 mg/dL (ref 0.3–1.2)
Total Protein: 6.4 g/dL — ABNORMAL LOW (ref 6.5–8.1)

## 2019-01-22 LAB — GLUCOSE, CAPILLARY
Glucose-Capillary: 137 mg/dL — ABNORMAL HIGH (ref 70–99)
Glucose-Capillary: 166 mg/dL — ABNORMAL HIGH (ref 70–99)
Glucose-Capillary: 200 mg/dL — ABNORMAL HIGH (ref 70–99)
Glucose-Capillary: 225 mg/dL — ABNORMAL HIGH (ref 70–99)
Glucose-Capillary: 336 mg/dL — ABNORMAL HIGH (ref 70–99)
Glucose-Capillary: 375 mg/dL — ABNORMAL HIGH (ref 70–99)
Glucose-Capillary: 386 mg/dL — ABNORMAL HIGH (ref 70–99)

## 2019-01-22 LAB — FERRITIN: Ferritin: 465 ng/mL — ABNORMAL HIGH (ref 24–336)

## 2019-01-22 LAB — LACTATE DEHYDROGENASE: LDH: 215 U/L — ABNORMAL HIGH (ref 98–192)

## 2019-01-22 LAB — D-DIMER, QUANTITATIVE: D-Dimer, Quant: 0.32 ug/mL-FEU (ref 0.00–0.50)

## 2019-01-22 LAB — C-REACTIVE PROTEIN: CRP: 0.8 mg/dL (ref <1.0)

## 2019-01-22 MED ORDER — ENOXAPARIN SODIUM 60 MG/0.6ML ~~LOC~~ SOLN
0.5000 mg/kg | SUBCUTANEOUS | Status: DC
  Administered 2019-01-23 – 2019-01-24 (×2): 60 mg via SUBCUTANEOUS
  Filled 2019-01-22 (×2): qty 0.6

## 2019-01-22 NOTE — Progress Notes (Signed)
PROGRESS NOTE  Terry Cole ZOX:[id number]:[id number] DOB: 09/30/1970 DOA: 01/16/2019 PCP: Patient, No Pcp Per   LOS: 6 days   Brief Narrative / Interim history: 48 year old male without significant past medical history, comes to the hospital and is admitted on 01/16/2019 with 3 to 4 days of fever, cough, shortness of breath, symptoms progressively worsening and decided to seek care.  He was initially admitted to the ICU due to high oxygen needs.  He has been stable in the ICU, and transferred to the floor on 01/17/2019  Subjective: Use of [employer] interpreter for this encounter.  Feels better, denies any significant shortness of breath.  Able to ambulate to the bathroom and back no difficulties  Assessment & Plan: Principal Problem:   Acute respiratory disease due to COVID-19 virus Active Problems:   Hypoxia   Principal Problem Acute Hypoxic Respiratory Failure due to Covid-19 Viral Illness -Stable respiratory status, continue to wean off as tolerated, currently on 7 L high flow nasal cannula -Patient feels Remdesivir, continue steroids -Inflammatory markers overall stable  COVID-19 Labs  Recent Labs    01/20/19 0413 01/21/19 0353 01/21/19 0355 01/22/19 0440  DDIMER 0.36  --  0.39 0.32  FERRITIN 452* 475*  --  465*  LDH  --   --  201* 215*  CRP 1.9* 1.2*  --  0.8    Lab Results  Component Value Date   SARSCOV2NAA POSITIVE (A) 01/16/2019   Fever:  Afebrile Oxygen requirements:  7 L high flow Antibiotics: No antibacterials Remdesivir:  Finished a 5-day course Steroids:  Solu-Medrol 7/8, day 7 Diuretics:  None Actemra:  2 doses, 7/8, 7/9 Convalescent Plasma: 7/10 Vitamin C and Zinc:  Yes  Active Problems Hyperglycemia -Hemoglobin A1c 10.7, CBGs stable today  CBG (last 3)  Recent Labs    01/22/19 0405 01/22/19 0536 01/22/19 0814  GLUCAP 225* 166* 137*    Morbid obesity -Will benefit from weight loss  Scheduled Meds: . Chlorhexidine Gluconate Cloth  6 each Topical  Q0600  . enoxaparin (LOVENOX) injection  0.5 mg/kg Subcutaneous Q12H  . famotidine  20 mg Oral BID  . insulin aspart  0-20 Units Subcutaneous Q4H  . insulin aspart  7 Units Subcutaneous TID WC  . insulin glargine  10 Units Subcutaneous Q24H  . methylPREDNISolone (SOLU-MEDROL) injection  60 mg Intravenous Q12H  . vitamin C  500 mg Oral Daily  . zinc sulfate  220 mg Oral Daily   Continuous Infusions:  PRN Meds:.acetaminophen, ondansetron **OR** ondansetron (ZOFRAN) IV, polyethylene glycol, sodium chloride  DVT prophylaxis: Lovenox Code Status: Full code Family Communication: d/w patient  Disposition Plan: home when ready   Consultants:   PCCM   Procedures:   None   Antimicrobials:  None    Objective: Vitals:   01/21/19 1546 01/21/19 2046 01/22/19 0407 01/22/19 0800  BP: 112/73 114/83 108/72   Pulse: 77 67 60   Resp: 19 19 20    Temp: 98.4 F (36.9 C) 97.8 F (36.6 C) 98.1 F (36.7 C) 98.1 F (36.7 C)  TempSrc: Oral Oral Oral   SpO2: 90% 93% 95%   Weight:      Height:        Intake/Output Summary (Last 24 hours) at 01/22/2019 1131 Last data filed at 01/22/2019 1034 Gross per 24 hour  Intake 840 ml  Output 3700 ml  Net -2860 ml   Filed Weights   01/16/19 1600 01/16/19 1810  Weight: 117.9 kg 121.9 kg    Examination:  Constitutional: No distress,  sitting in the bedside chair, eating breakfast Eyes: No icterus seen ENMT: Moist mucous membranes Respiratory: Bibasilar rhonchi, no wheezing, no crackles, good air movement Cardiovascular: Regular rate and rhythm, no murmurs appreciated.  No peripheral edema Abdomen: Soft, NT, ND, positive bowel sounds Musculoskeletal: no clubbing / cyanosis.  Skin: No rashes seen Neurologic: Nonfocal, equal strength, ambulatory  Data Reviewed: I have independently reviewed following labs  CBC: Recent Labs  Lab 01/17/19 0606 01/18/19 0230 01/19/19 0315 01/20/19 0413 01/21/19 0355 01/22/19 0440  WBC 3.3* 8.0 11.9* 8.2  8.9 10.1  NEUTROABS 2.6 7.0 9.7* 7.3 7.9*  --   HGB 15.7 14.9 13.8 13.9 15.2 15.6  HCT 46.8 44.5 42.1 42.1 44.6 47.2  MCV 93.4 94.3 94.8 93.6 92.3 92.7  PLT 359 501* 599* 556* 552* 611*   Basic Metabolic Panel: Recent Labs  Lab 01/17/19 0606 01/18/19 0230 01/19/19 0315 01/20/19 0413 01/21/19 0355 01/22/19 0440  NA 139 138 142 137 138 137  K 4.1 3.9 3.3* 4.0 4.3 4.4  CL 103 101 105 100 101 97*  CO2 22 23 26 29 29 30   GLUCOSE 247* 154* 59* 189* 170* 172*  BUN 21* 25* 31* 23* 19 22*  CREATININE 0.53* 0.52* 0.62 0.68 0.59* 0.54*  CALCIUM 9.0 8.7* 8.8* 8.7* 8.8* 8.8*  MG 2.3 2.2 2.2 2.1 2.3  --    GFR: Estimated Creatinine Clearance: 152.7 mL/min (A) (by C-G formula based on SCr of 0.54 mg/dL (L)). Liver Function Tests: Recent Labs  Lab 01/18/19 0230 01/19/19 0315 01/20/19 0413 01/21/19 0355 01/22/19 0440  AST 27 27 24 26 20   ALT 54* 46* 55* 60* 58*  ALKPHOS 76 79 75 83 93  BILITOT 0.5 0.3 0.2* 0.6 0.4  PROT 6.9 6.7 6.3* 6.4* 6.4*  ALBUMIN 3.0* 3.2* 3.1* 3.1* 3.4*   No results for input(s): LIPASE, AMYLASE in the last 168 hours. No results for input(s): AMMONIA in the last 168 hours. Coagulation Profile: No results for input(s): INR, PROTIME in the last 168 hours. Cardiac Enzymes: No results for input(s): CKTOTAL, CKMB, CKMBINDEX, TROPONINI in the last 168 hours. BNP (last 3 results) No results for input(s): PROBNP in the last 8760 hours. HbA1C: No results for input(s): HGBA1C in the last 72 hours. CBG: Recent Labs  Lab 01/21/19 2029 01/22/19 0008 01/22/19 0405 01/22/19 0536 01/22/19 0814  GLUCAP 393* 200* 225* 166* 137*   Lipid Profile: No results for input(s): CHOL, HDL, LDLCALC, TRIG, CHOLHDL, LDLDIRECT in the last 72 hours. Thyroid Function Tests: No results for input(s): TSH, T4TOTAL, FREET4, T3FREE, THYROIDAB in the last 72 hours. Anemia Panel: Recent Labs    01/21/19 0353 01/22/19 0440  FERRITIN 475* 465*   Urine analysis: No results found  for: COLORURINE, APPEARANCEUR, LABSPEC, PHURINE, GLUCOSEU, HGBUR, BILIRUBINUR, KETONESUR, PROTEINUR, UROBILINOGEN, NITRITE, LEUKOCYTESUR Sepsis Labs: Invalid input(s): PROCALCITONIN, LACTICIDVEN  Recent Results (from the past 240 hour(s))  Blood Culture (routine x 2)     Status: None   Collection Time: 01/16/19 11:25 AM   Specimen: BLOOD  Result Value Ref Range Status   Specimen Description BLOOD LEFT ANTECUBITAL  Final   Special Requests   Final    BOTTLES DRAWN AEROBIC AND ANAEROBIC Blood Culture adequate volume   Culture   Final    NO GROWTH 5 DAYS Performed at [facility], [address] [place], [place] [address]    Report Status 01/21/2019 FINAL  Final  Blood Culture (routine x 2)     Status: None  Collection Time: 01/16/19 11:25 AM   Specimen: BLOOD  Result Value Ref Range Status   Specimen Description BLOOD RIGHT ANTECUBITAL  Final   Special Requests   Final    BOTTLES DRAWN AEROBIC AND ANAEROBIC Blood Culture adequate volume   Culture   Final    NO GROWTH 5 DAYS Performed at [facility], [address], [place], [place] [address]    Report Status 01/21/2019 FINAL  Final  SARS Coronavirus 2 [place] order, Performed in [facility] hospital lab)     Status: Abnormal   Collection Time: 01/16/19 11:50 AM   Specimen: Nasopharyngeal Swab  Result Value Ref Range Status   SARS Coronavirus 2 POSITIVE (A) NEGATIVE Final    Comment: RESULT CALLED TO, READ BACK BY AND VERIFIED WITH: Bedelia Person RN 13:15 01/16/19 ([email or web address]) (NOTE) If result is NEGATIVE SARS-CoV-2 target nucleic acids are NOT DETECTED. The SARS-CoV-2 RNA is generally detectable in upper and lower  respiratory specimens during the acute phase of infection. The lowest  concentration of SARS-CoV-2 viral copies this assay can detect is 250  copies / mL. A negative result does not preclude SARS-CoV-2 infection  and should not be used as the sole basis for treatment or other  patient management decisions.   A negative result may occur with  improper specimen collection / handling, submission of specimen other  than nasopharyngeal swab, presence of viral mutation(s) within the  areas targeted by this assay, and inadequate number of viral copies  (<250 copies / mL). A negative result must be combined with clinical  observations, patient history, and epidemiological information. If result is POSITIVE SARS-CoV-2 target nucleic acids are DETECTED. Th e SARS-CoV-2 RNA is generally detectable in upper and lower  respiratory specimens during the acute phase of infection.  Positive  results are indicative of active infection with SARS-CoV-2.  Clinical  correlation with patient history and other diagnostic information is  necessary to determine patient infection status.  Positive results do  not rule out bacterial infection or co-infection with other viruses. If result is PRESUMPTIVE POSTIVE SARS-CoV-2 nucleic acids MAY BE PRESENT.   A presumptive positive result was obtained on the submitted specimen  and confirmed on repeat testing.  While 2019 novel coronavirus  (SARS-CoV-2) nucleic acids may be present in the submitted sample  additional confirmatory testing may be necessary for epidemiological  and / or clinical management purposes  to differentiate between  SARS-CoV-2 and other Sarbecovirus currently known to infect humans.  If clinically indicated additional testing with an alternate test  methodology [phone]) is  advised. The SARS-CoV-2 RNA is generally  detectable in upper and lower respiratory specimens during the acute  phase of infection. The expected result is Negative. Fact Sheet for Patients:  BoilerBrush.com.cy Fact Sheet for Healthcare Providers: https://pope.com/ This test is not yet approved or cleared by the [place] FDA and has been authorized for detection and/or diagnosis of SARS-CoV-2 by FDA under an Emergency Use  Authorization (EUA).  This EUA will remain in effect (meaning this test can be used) for the duration of the COVID-19 declaration under Section 564(b)(1) of the Act, 21 U.S.C. section 360bbb-3(b)(1), unless the authorization is terminated or revoked sooner. Performed at [facility], [address], [place], [place] [address]   MRSA PCR Screening     Status: None   Collection Time: 01/16/19  6:18 PM   Specimen: Nasal Mucosa; Nasopharyngeal  Result Value Ref Range Status   MRSA by PCR NEGATIVE  NEGATIVE Final    Comment:        The GeneXpert MRSA Assay (FDA approved for NASAL specimens only), is one component of a comprehensive MRSA colonization surveillance program. It is not intended to diagnose MRSA infection nor to guide or monitor treatment for MRSA infections. Performed at [facility], [place] [address], [place], [place] [address]       Radiology Studies: No results found.   Marzetta Board, MD, PhD [facility]  Contact via  [email or web address]  [place] P: [phone] F: [phone]

## 2019-01-23 LAB — BASIC METABOLIC PANEL
Anion gap: 9 (ref 5–15)
BUN: 23 mg/dL — ABNORMAL HIGH (ref 6–20)
CO2: 28 mmol/L (ref 22–32)
Calcium: 8.7 mg/dL — ABNORMAL LOW (ref 8.9–10.3)
Chloride: 100 mmol/L (ref 98–111)
Creatinine, Ser: 0.58 mg/dL — ABNORMAL LOW (ref 0.61–1.24)
GFR calc Af Amer: >60 mL/min (ref >60)
GFR calc non Af Amer: >60 mL/min (ref >60)
Glucose, Bld: 142 mg/dL — ABNORMAL HIGH (ref 70–99)
Potassium: 4.5 mmol/L (ref 3.5–5.1)
Sodium: 137 mmol/L (ref 135–145)

## 2019-01-23 LAB — CBC
HCT: 46.8 % (ref 39.0–52.0)
Hemoglobin: 15.6 g/dL (ref 13.0–17.0)
MCH: 30.7 pg (ref 26.0–34.0)
MCHC: 33.3 g/dL (ref 30.0–36.0)
MCV: 92.1 fL (ref 80.0–100.0)
Platelets: 627 10*3/uL — ABNORMAL HIGH (ref 150–400)
RBC: 5.08 MIL/uL (ref 4.22–5.81)
RDW: 11.9 % (ref 11.5–15.5)
WBC: 12.5 10*3/uL — ABNORMAL HIGH (ref 4.0–10.5)
nRBC: 0 % (ref 0.0–0.2)

## 2019-01-23 LAB — D-DIMER, QUANTITATIVE: D-Dimer, Quant: <0.27 ug/mL-FEU (ref 0.00–0.50)

## 2019-01-23 LAB — C-REACTIVE PROTEIN: CRP: <0.8 mg/dL (ref <1.0)

## 2019-01-23 LAB — LACTATE DEHYDROGENASE: LDH: 199 U/L — ABNORMAL HIGH (ref 98–192)

## 2019-01-23 LAB — GLUCOSE, CAPILLARY
Glucose-Capillary: 118 mg/dL — ABNORMAL HIGH (ref 70–99)
Glucose-Capillary: 165 mg/dL — ABNORMAL HIGH (ref 70–99)
Glucose-Capillary: 288 mg/dL — ABNORMAL HIGH (ref 70–99)
Glucose-Capillary: 292 mg/dL — ABNORMAL HIGH (ref 70–99)
Glucose-Capillary: 344 mg/dL — ABNORMAL HIGH (ref 70–99)
Glucose-Capillary: 374 mg/dL — ABNORMAL HIGH (ref 70–99)
Glucose-Capillary: 93 mg/dL (ref 70–99)

## 2019-01-23 LAB — FERRITIN: Ferritin: 428 ng/mL — ABNORMAL HIGH (ref 24–336)

## 2019-01-23 MED ORDER — INSULIN ASPART 100 UNIT/ML ~~LOC~~ SOLN
0.0000 [IU] | Freq: Three times a day (TID) | SUBCUTANEOUS | Status: DC
  Administered 2019-01-23: 11 [IU] via SUBCUTANEOUS
  Administered 2019-01-24: 15 [IU] via SUBCUTANEOUS

## 2019-01-23 MED ORDER — DEXAMETHASONE SODIUM PHOSPHATE 10 MG/ML IJ SOLN
6.0000 mg | INTRAMUSCULAR | Status: DC
  Administered 2019-01-23 – 2019-01-24 (×2): 6 mg via INTRAVENOUS
  Filled 2019-01-23 (×2): qty 1

## 2019-01-23 MED ORDER — METFORMIN HCL 500 MG PO TABS
500.0000 mg | ORAL_TABLET | Freq: Every day | ORAL | Status: DC
  Administered 2019-01-23 – 2019-01-24 (×2): 500 mg via ORAL
  Filled 2019-01-23 (×2): qty 1

## 2019-01-23 MED ORDER — GLIPIZIDE 2.5 MG HALF TABLET
2.5000 mg | ORAL_TABLET | Freq: Every day | ORAL | Status: DC
  Administered 2019-01-24: 2.5 mg via ORAL
  Filled 2019-01-23 (×2): qty 1

## 2019-01-23 NOTE — Progress Notes (Signed)
Patient is keeping family updated on his condition.  He did not want me to call his sister.  Earleen Reaper RN

## 2019-01-23 NOTE — Progress Notes (Signed)
PROGRESS NOTE    Terry Cole  ZHY:[id number] DOB: 05/05/1971 DOA: 01/16/2019 PCP: Patient, No Pcp Per      Brief Narrative:  Terry Cole is a 48 y.o. M with no sig PMHx who presented with 4 days fever, cough, SOB.  In ER, had high O2 needs and bilateral opacities, admitted to ICU for COVID.     Assessment & Plan:  Coronavirus pneumonitis with acute hypoxic respiratory failure In setting of ongoing 2020 COVID-19 pandemic.  S/p remdesivir 5d S/p Actemra 7/8 and 7/9 S/p plasma 7/10  Weaned off O2. -Continue Solumedrol, day 8 --> narrow to Decadron 6 mg IV -VTE PPx with Lovenox -Continue Zinc and Vitamin C   COVID-19 Labs Recent Labs    01/21/19 0353 01/21/19 0355 01/22/19 0440 01/23/19 0526  DDIMER  --  0.39 0.32 <0.27  FERRITIN 475*  --  465* 428*  LDH  --  201* 215* 199*  CRP 1.2*  --  0.8 <0.8      Diabetes AM glucose good, post-prandial glucose elevated. -Stop Lantus and scheduled mealtime -Start metformin and glipizide -Consult CM for PCP follow up and med assist -Continue SSI corrections  Hypotension BP soft today -Monitor BP  Other medications -Continue famotidine        MDM and disposition: The below labs and imaging reports were reviewed and summarized above.  Medication management as above.  The patient was admitted with acute hypoxic respiratory failure from COVID.  His O2 needs have decreased but he now requires transition to an oral diabetes regimen while on steroids, given his large insulin requirements, no previous diagnosis of diabetes, and no PCP follow up previously established, he will need extensive teaching on diabetes, CM ocnsultation for coordination of post-discharge care to prevent readmission for diabetes related complications; likely home tomorrow if glucoses stable.        DVT prophylaxis: Lovenox Code Status: FULL Family Communication: None    Consultants:   Diabetes ed  Procedures:   None   Antimicrobials:   Ceftriaxone x1 7/8  Azithromyfcin x1 7/18   Culture data:   7/8 blood culture x2 NGTD       Subjective: All history collected through videophonic interpreter Feeling better.  Still tired.  No feever.  No confusion.  No vomiting.  No dizziness with standing.    Objective: Vitals:   01/23/19 0809 01/23/19 0852 01/23/19 0911 01/23/19 1215  BP:  108/77  105/73  Pulse: 72  81   Resp: (!) 24  (!) 26   Temp:  98.4 F (36.9 C)  98.2 F (36.8 C)  TempSrc:  Oral  Oral  SpO2: 90% 93% 92%   Weight:      Height:        Intake/Output Summary (Last 24 hours) at 01/23/2019 1442 Last data filed at 01/23/2019 1111 Gross per 24 hour  Intake 360 ml  Output 3200 ml  Net -2840 ml   Filed Weights   01/16/19 1600 01/16/19 1810  Weight: 117.9 kg 121.9 kg    Examination: General appearance: obese adult male, alert and in no acute distress.  Sitting in recliner HEENT: Anicteric, conjunctiva pink, lids and lashes normal. No nasal deformity, discharge, epistaxis.  Lips moist.   Skin: Warm and dry.  no jaundice.  No suspicious rashes or lesions. Cardiac: RRR, nl S1-S2, no murmurs appreciated.  Capillary refill is brisk.  JVP not visible.  No LE edema.  Radial pulses 2+ and symmetric. Respiratory: Normal respiratory rate and  rhythm.  CTAB without rales or wheezes. Abdomen: Abdomen soft.  No TTP. No ascites, distension, hepatosplenomegaly.   MSK: No deformities or effusions. Neuro: Awake and alert.  EOMI, moves all extremities. Speech fluent.    Psych: Sensorium intact and responding to questions, attention normal. Affect normal.  Judgment and insight appear normal.      Data Reviewed: I have personally reviewed following labs and imaging studies:  CBC: Recent Labs  Lab 01/17/19 0606 01/18/19 0230 01/19/19 0315 01/20/19 0413 01/21/19 0355 01/22/19 0440 01/23/19 0526  WBC 3.3* 8.0 11.9* 8.2 8.9 10.1 12.5*  NEUTROABS 2.6 7.0 9.7* 7.3 7.9*  --   --   HGB 15.7  14.9 13.8 13.9 15.2 15.6 15.6  HCT 46.8 44.5 42.1 42.1 44.6 47.2 46.8  MCV 93.4 94.3 94.8 93.6 92.3 92.7 92.1  PLT 359 501* 599* 556* 552* 611* 627*   Basic Metabolic Panel: Recent Labs  Lab 01/17/19 0606 01/18/19 0230 01/19/19 0315 01/20/19 0413 01/21/19 0355 01/22/19 0440 01/23/19 0526  NA 139 138 142 137 138 137 137  K 4.1 3.9 3.3* 4.0 4.3 4.4 4.5  CL 103 101 105 100 101 97* 100  CO2 22 23 26 29 29 30 28   GLUCOSE 247* 154* 59* 189* 170* 172* 142*  BUN 21* 25* 31* 23* 19 22* 23*  CREATININE 0.53* 0.52* 0.62 0.68 0.59* 0.54* 0.58*  CALCIUM 9.0 8.7* 8.8* 8.7* 8.8* 8.8* 8.7*  MG 2.3 2.2 2.2 2.1 2.3  --   --    GFR: Estimated Creatinine Clearance: 152.7 mL/min (A) (by C-G formula based on SCr of 0.58 mg/dL (L)). Liver Function Tests: Recent Labs  Lab 01/18/19 0230 01/19/19 0315 01/20/19 0413 01/21/19 0355 01/22/19 0440  AST 27 27 24 26 20   ALT 54* 46* 55* 60* 58*  ALKPHOS 76 79 75 83 93  BILITOT 0.5 0.3 0.2* 0.6 0.4  PROT 6.9 6.7 6.3* 6.4* 6.4*  ALBUMIN 3.0* 3.2* 3.1* 3.1* 3.4*   No results for input(s): LIPASE, AMYLASE in the last 168 hours. No results for input(s): AMMONIA in the last 168 hours. Coagulation Profile: No results for input(s): INR, PROTIME in the last 168 hours. Cardiac Enzymes: No results for input(s): CKTOTAL, CKMB, CKMBINDEX, TROPONINI in the last 168 hours. BNP (last 3 results) No results for input(s): PROBNP in the last 8760 hours. HbA1C: No results for input(s): HGBA1C in the last 72 hours. CBG: Recent Labs  Lab 01/23/19 0035 01/23/19 0359 01/23/19 0803 01/23/19 1218 01/23/19 1301  GLUCAP 93 165* 118* 374* 344*   Lipid Profile: No results for input(s): CHOL, HDL, LDLCALC, TRIG, CHOLHDL, LDLDIRECT in the last 72 hours. Thyroid Function Tests: No results for input(s): TSH, T4TOTAL, FREET4, T3FREE, THYROIDAB in the last 72 hours. Anemia Panel: Recent Labs    01/22/19 0440 01/23/19 0526  FERRITIN 465* 428*   Urine analysis: No  results found for: COLORURINE, APPEARANCEUR, LABSPEC, PHURINE, GLUCOSEU, HGBUR, BILIRUBINUR, KETONESUR, PROTEINUR, UROBILINOGEN, NITRITE, LEUKOCYTESUR Sepsis Labs: @LABRCNTIP (procalcitonin:4,lacticacidven:4)  ) Recent Results (from the past 240 hour(s))  Blood Culture (routine x 2)     Status: None   Collection Time: 01/16/19 11:25 AM   Specimen: BLOOD  Result Value Ref Range Status   Specimen Description BLOOD LEFT ANTECUBITAL  Final   Special Requests   Final    BOTTLES DRAWN AEROBIC AND ANAEROBIC Blood Culture adequate volume   Culture   Final    NO GROWTH 5 DAYS Performed at [facility], [address] [place], [place]  [address]    Report Status 01/21/2019 FINAL  Final  Blood Culture (routine x 2)     Status: None   Collection Time: 01/16/19 11:25 AM   Specimen: BLOOD  Result Value Ref Range Status   Specimen Description BLOOD RIGHT ANTECUBITAL  Final   Special Requests   Final    BOTTLES DRAWN AEROBIC AND ANAEROBIC Blood Culture adequate volume   Culture   Final    NO GROWTH 5 DAYS Performed at [facility], [address] [place], [place] [address]    Report Status 01/21/2019 FINAL  Final  SARS Coronavirus 2 [facility](Hospital order, Performed in [facility] hospital lab)     Status: Abnormal   Collection Time: 01/16/19 11:50 AM   Specimen: Nasopharyngeal Swab  Result Value Ref Range Status   SARS Coronavirus 2 POSITIVE (A) NEGATIVE Final    Comment: RESULT CALLED TO, READ BACK BY AND VERIFIED WITH: Bedelia Person. Hardy RN 13:15 01/16/19 ([email or web address]) (NOTE) If result is NEGATIVE SARS-CoV-2 target nucleic acids are NOT DETECTED. The SARS-CoV-2 RNA is generally detectable in upper and lower  respiratory specimens during the acute phase of infection. The lowest  concentration of SARS-CoV-2 viral copies this assay can detect is 250  copies / mL. A negative result does not preclude SARS-CoV-2 infection  and should not be used as the sole basis for treatment or other  patient  management decisions.  A negative result may occur with  improper specimen collection / handling, submission of specimen other  than nasopharyngeal swab, presence of viral mutation(s) within the  areas targeted by this assay, and inadequate number of viral copies  (<250 copies / mL). A negative result must be combined with clinical  observations, patient history, and epidemiological information. If result is POSITIVE SARS-CoV-2 target nucleic acids are DETECTED. Th e SARS-CoV-2 RNA is generally detectable in upper and lower  respiratory specimens during the acute phase of infection.  Positive  results are indicative of active infection with SARS-CoV-2.  Clinical  correlation with patient history and other diagnostic information is  necessary to determine patient infection status.  Positive results do  not rule out bacterial infection or co-infection with other viruses. If result is PRESUMPTIVE POSTIVE SARS-CoV-2 nucleic acids MAY BE PRESENT.   A presumptive positive result was obtained on the submitted specimen  and confirmed on repeat testing.  While 2019 novel coronavirus  (SARS-CoV-2) nucleic acids may be present in the submitted sample  additional confirmatory testing may be necessary for epidemiological  and / or clinical management purposes  to differentiate between  SARS-CoV-2 and other Sarbecovirus currently known to infect humans.  If clinically indicated additional testing with an alternate test  methodology [phone]([id number]) is  advised. The SARS-CoV-2 RNA is generally  detectable in upper and lower respiratory specimens during the acute  phase of infection. The expected result is Negative. Fact Sheet for Patients:  BoilerBrush.com.cyhttps://www.fda.gov/media/136312/download Fact Sheet for Healthcare Providers: https://pope.com/https://www.fda.gov/media/136313/download This test is not yet approved or cleared by the [place] FDA and has been authorized for detection and/or diagnosis of SARS-CoV-2 by FDA under  an Emergency Use Authorization (EUA).  This EUA will remain in effect (meaning this test can be used) for the duration of the COVID-19 declaration under Section 564(b)(1) of the Act, 21 U.S.C. section 360bbb-3(b)(1), unless the authorization is terminated or revoked sooner. Performed at [facility], [address] [place], [place] [address]   MRSA PCR Screening     Status: None  Collection Time: 01/16/19  6:18 PM   Specimen: Nasal Mucosa; Nasopharyngeal  Result Value Ref Range Status   MRSA by PCR NEGATIVE NEGATIVE Final    Comment:        The GeneXpert MRSA Assay (FDA approved for NASAL specimens only), is one component of a comprehensive MRSA colonization surveillance program. It is not intended to diagnose MRSA infection nor to guide or monitor treatment for MRSA infections. Performed at [facility], [place] [address], [place], [place] [address]          Radiology Studies: No results found.      Scheduled Meds: . Chlorhexidine Gluconate Cloth  6 each Topical Q0600  . dexamethasone (DECADRON) injection  6 mg Intravenous Q24H  . enoxaparin (LOVENOX) injection  0.5 mg/kg Subcutaneous Q24H  . famotidine  20 mg Oral BID  . [START ON 01/24/2019] glipiZIDE  2.5 mg Oral QAC breakfast  . insulin aspart  0-20 Units Subcutaneous Q4H  . metFORMIN  500 mg Oral Q breakfast  . vitamin C  500 mg Oral Daily  . zinc sulfate  220 mg Oral Daily   Continuous Infusions:   LOS: 7 days    Time spent: 25 minutes      Edwin Dada, MD [facility] 01/23/2019, 2:42 PM     Please page through [place]:  [email or web address] Password [email or web address] If 7PM-7AM, please contact night-coverage

## 2019-01-23 NOTE — Progress Notes (Signed)
   01/23/19 1925  Provider Notification  Provider Name/Title [facility]  Date Provider Notified 01/23/19  Time Provider Notified 1925  Notification Type Page  Notification Reason Other (Comment) (change ins cov from Q4 hours to AC/HS)  Response See new orders  Date of Provider Response 01/23/19

## 2019-01-24 DIAGNOSIS — E119 Type 2 diabetes mellitus without complications: Secondary | ICD-10-CM

## 2019-01-24 LAB — GLUCOSE, CAPILLARY
Glucose-Capillary: 120 mg/dL — ABNORMAL HIGH (ref 70–99)
Glucose-Capillary: 316 mg/dL — ABNORMAL HIGH (ref 70–99)

## 2019-01-24 MED ORDER — GLIPIZIDE 5 MG PO TABS: 2.5000 mg | ORAL_TABLET | Freq: Every day | ORAL | 3 refills | Status: DC

## 2019-01-24 MED ORDER — METFORMIN HCL 500 MG PO TABS: 500.0000 mg | ORAL_TABLET | Freq: Every day | ORAL | 0 refills | Status: DC

## 2019-01-24 MED ORDER — BLOOD GLUCOSE METER KIT: PACK | 0 refills | Status: AC

## 2019-01-24 NOTE — TOC Initial Note (Addendum)
Transition of Care [facility](TOC) - Initial/Assessment Note    Patient Details  Name: Terry ClontsHermelindo Cole MRN: [id number] Date of Birth: 08/09/1970  Transition of Care [facility](TOC) CM/SW Contact:    Cherylann Parrlaxton, Troyce Gieske S, RN Phone Number: 01/24/2019, 9:54 AM  Clinical Narrative:   PTA independent from home with roommate.  CM spoke with both pt and sister via interpretor service.  Pt in agreement with home oxygen - CM provided pt choice and pt chose [facility].  Agency contacted and will accept pt as charity.  Pt and sister request home oxygen equipment be delivered to sisters home and sister will then take to pts home immediately post discharge.  Pts address is [address] [place] [address] - [facility] will speak directly with sister to schedule delivery.   Pt informed CM that he does not have a thermometer in the home - [facility] will deliver portable [facility] oxygen tank, thermometer and pulse ox to bedside prior to discharge.  Pt and sister made aware of follow up PCP appt.          Pt to discharge home on new diabetic meds/supplies :  [facility] will be the most cost effective place for supplies.  Pt states he can pay copay for discharge medications out of pocket.  CM also reviewed with pt supplies will include; lancets, strips and glucometer, CM also informed pt that [facility] can [facility] to help him pick out the appropriate DM supplies.  Bedside will confirm with sister that oxygen has been delivered prior to discharging pt home - ETA was 12-1pm today.  Expected Discharge Plan: Home/Self Care     Patient Goals and CMS Choice   CMS Medicare.gov Compare Post Acute Care list provided to:: Patient Choice offered to / list presented to : Patient  Expected Discharge Plan and Services Expected Discharge Plan: Home/Self Care   Discharge Planning Services: CM Consult Post Acute Care Choice: Durable Medical Equipment Living arrangements for the past 2 months: Single Family Home                 DME Arranged:  Oxygen DME Agency: [facility] Date DME Agency Contacted: 01/24/19 Time DME Agency Contacted: [phone] Representative spoke with at DME Agency: Verlon AuLeslie            Prior Living Arrangements/Services Living arrangements for the past 2 months: Single Family Home Lives with:: Roommate Patient language and need for interpreter reviewed:: Yes(utilized intrepretor services)        Need for Family Participation in Patient Care: No (Comment) Care giver support system in place?: Yes (comment)(sister)   Criminal Activity/Legal Involvement Pertinent to Current Situation/Hospitalization: No - Comment as needed  Activities of Daily Living Home Assistive Devices/Equipment: None ADL Screening (condition at time of admission) Patient's cognitive ability adequate to safely complete daily activities?: Yes Is the patient deaf or have difficulty hearing?: No Does the patient have difficulty seeing, even when wearing glasses/contacts?: No Does the patient have difficulty concentrating, remembering, or making decisions?: No Patient able to express need for assistance with ADLs?: Yes Does the patient have difficulty dressing or bathing?: No Independently performs ADLs?: Yes (appropriate for developmental age) Does the patient have difficulty walking or climbing stairs?: No Weakness of Legs: Right Weakness of Arms/Hands: Right  Permission Sought/Granted Permission sought to share information with : Family Supports Permission granted to share information with : Yes, Verbal Permission Granted  Share Information with NAME: Stephens ShireYarely Sween (sister)  Permission granted to share info w AGENCY: [facility]  Emotional Assessment     Affect (typically observed): Accepting, Adaptable Orientation: : Oriented to Self, Oriented to Place, Oriented to  Time, Oriented to Situation   Psych Involvement: No (comment)  Admission diagnosis:  Acute respiratory disease due to COVID-19 virus [U07.1, J06.9] Patient  Active Problem List   Diagnosis Date Noted  . Acute respiratory disease due to COVID-19 virus 01/16/2019  . Hypoxia 01/16/2019   PCP:  Patient, No Pcp Per Pharmacy:   [facility], [place] [address] Phone: [phone] Fax: [phone]     Social Determinants of Health (SDOH) Interventions    Readmission Risk Interventions No flowsheet data found.

## 2019-01-24 NOTE — Discharge Summary (Signed)
Physician Discharge Summary  Knight Oelkers KDT:[id number] DOB: [birth year] DOA: 01/16/2019  PCP: Patient, No Pcp Per  Admit date: 01/16/2019 Discharge date: 01/24/2019  Admitted From: Home  Disposition:  Home   Recommendations for Outpatient Follow-up:  1. Follow up with new PCP on July 27 2. Please obtain HgbA1c in 3 months and review recent blood glucose charts 3. Please wean off O2 as able   Home Health: None  Equipment/Devices: Oxygen  Discharge Condition: Good  CODE STATUS: FULL Diet recommendation: Diabetic  Brief/Interim Summary: Mr. Montanari is a 48 y.o. M with no sig PMHx who presented with 4 days fever, cough, SOB.  In ER, had high O2 needs and bilateral opacities, admitted to ICU for [place].      PRINCIPAL HOSPITAL DIAGNOSIS: Severe COVID-19    Discharge Diagnoses:  Coronavirus pneumonitis with acute hypoxic respiratory failure In setting of ongoing 2020 COVID-19 pandemic.  Was admitted and treated with five days remdesivir 5d, Actemra x2, and convalescent plasma as well as zinc, vitamin C and prophylactic Lovenox.  O2 slowly weaned.      Diabetes This was a new diagnosis.  Treated with Lantus and sliding scale here.  Transitioned to oral agents before discharge and had good control with metformin and glipizide.  Discharged with close outpatietn follow up at [facility].              Discharge Instructions  Discharge Instructions    Diet Carb Modified   Complete by: As directed    Discharge instructions   Complete by: As directed    You were admitted for coronavirus (Also known as COVID-19)  You were treated with two medicines: an anti-inflammatory (a "steroid") and an anti-virus medicine (remdesivir).  You completed both courses here in the hospital.  If you have any lingering cough, you should take the cough syrup we gave you here, Robitussin (with the ingredients "GUIAFENESIN" and "DEXTROMETHORPHAN")  Use the oxygen all the time until you see  your primary care doctor. The purpose of the  oxygen is to keep your oxygen level at 88% or above Use a pulse oximeter to measure your oxygen level.  If the home health agency doesn't bring you one of these, you can find them at any drug store. If you are sitting resting quietly, and your oxygen level is above 90% without the oxygen, you are safe to take the oxygen off, but based on our testing here, you will need it at 2L at least and possibly up to 4L to keep your oxygen levels above 88% while walking.   HOW LONG TO REMAIN IN QUARANTINE: There is some uncertainty about this. The CDC and our local health departments recommend you isolate strictly until 10 days from your first symptoms AND at least 3 days from your last fever (your last fever here was on July 8). My personal recommendation is more strict, and I recommend you isolate for 7 days after discharge.  In the end, the [place] have the sole discretion to judge when you may leave your house or return to work.   You have no personal health conditions that require special self-isolation precautions.    Until the health department has cleared you to end your quarantine: If you have anyone in the home who has NOT had coronavirus:    -do not be in the same room with them until your self isolation is over    -wear a mask and have them wear a mask if you  MUST be in the same room    -clean all hard surfaces (counters, doors, tables) twice a day    -use a separate bathroom at all times      For diabetes: Follow up with the [facility] Take two medicines for diabetes: Take metformin 500 mg once daily in the morning (the clinic may increase this dose) Take glipizide 2.5 mg once daily in the morning  Check your blood sugar each morning.  A "normal" blood sugar in the morning when you wake up is between 70 and 120  A "normal" blood sugar after eating is 120 to 180  If your blood sugar  is ever less than 70, you might have symptoms of shakes, fatigue, nausea, dizziness.  If this happens, drink some juice and check your sugar again in 15 minutes.  If the symptoms don't go away, go to the ER.  If your sugar is every greater than 400, call your new primary care doctor          VERY IMPORTANT: Keep your follow up appointment with the [facility] clinic on July 27th (date and time listed below).     Allergies as of 01/24/2019   No Known Allergies     Medication List    STOP taking these medications   methocarbamol 500 MG tablet Commonly known as: ROBAXIN   naproxen 500 MG tablet Commonly known as: NAPROSYN     TAKE these medications   blood glucose meter kit and supplies Dispense based on patient and insurance preference. Use up to four times daily as directed. (FOR ICD-10 E10.9, E11.9).   glipiZIDE 5 MG tablet Commonly known as: GLUCOTROL Take 0.5 tablets (2.5 mg total) by mouth daily before breakfast. Start taking on: January 25, 2019   metFORMIN 500 MG tablet Commonly known as: GLUCOPHAGE Take 1 tablet (500 mg total) by mouth daily with breakfast. Start taking on: January 25, 2019            Durable Medical Equipment  (From admission, onward)         Start     Ordered   01/24/19 0827  DME Oxygen  Once    Question Answer Comment  Length of Need 6 Months   Mode or (Route) Nasal cannula   Liters per Minute 2   Frequency Continuous (stationary and portable oxygen unit needed)   Oxygen conserving device Yes   Oxygen delivery system Gas      01/24/19 0826         Follow-up Information    [facility] Follow up on 02/04/2019.   Why: at 2:30pm for your hospital telephone appointment. Please be available for the call. Can utilize [facility] for discounted prescriptions for $4-$10 Contact information: Home [phone]          No Known Allergies  Consultations:  None   Procedures/Studies: Dg Chest Port 1 View  Result Date: 01/16/2019 CLINICAL DATA:  Shortness of breath. EXAM: PORTABLE CHEST 1 VIEW COMPARISON:  None. FINDINGS: Mild cardiomegaly is noted. Hypoinflation of the lungs is noted. Large right lower lobe opacity is noted most consistent with pneumonia. Mild left basilar atelectasis or infiltrate is noted. No pneumothorax is noted. Small pleural effusions cannot be excluded. Bony thorax is unremarkable. IMPRESSION: Hypoinflation of the lungs. Large right lower lobe opacity is noted most consistent with pneumonia. Mild left basilar atelectasis or infiltrate.  Electronically Signed   By: Marijo Conception M.D.   On: 01/16/2019 12:12      Subjective: Feeling well.  No chest pain, dyspnea, hemoptysis, confusion, no more fever.  No vomiting; taking PO well.  Discharge Exam: Vitals:   01/24/19 0645 01/24/19 0810  BP: 111/75 112/80  Pulse: (!) 57 72  Resp: 18   Temp: 97.8 F (36.6 C)   SpO2: 94% 90%   Vitals:   01/24/19 0500 01/24/19 0600 01/24/19 0645 01/24/19 0810  BP:   111/75 112/80  Pulse: (!) 56 (!) 58 (!) 57 72  Resp:   18   Temp:   97.8 F (36.6 C)   TempSrc:   Oral   SpO2: 92% 93% 94% 90%  Weight:      Height:        General: Pt is alert, awake, not in acute distress, sitting up in chair Cardiovascular: RRR, nl S1-S2, no murmurs appreciated.   No LE edema.   Respiratory: Normal respiratory rate and rhythm.  CTAB without rales or wheezes. Abdominal: Abdomen soft and non-tender.  No distension or HSM.   Neuro/Psych: Strength symmetric in upper and lower extremities.  Judgment and insight appear normal.   The results of significant diagnostics from this hospitalization (including imaging, microbiology, ancillary and laboratory) are listed below for reference.     Microbiology: Recent Results (from the past 240 hour(s))  Blood Culture (routine x 2)     Status: None    Collection Time: 01/16/19 11:25 AM   Specimen: BLOOD  Result Value Ref Range Status   Specimen Description BLOOD LEFT ANTECUBITAL  Final   Special Requests   Final    BOTTLES DRAWN AEROBIC AND ANAEROBIC Blood Culture adequate volume   Culture   Final    NO GROWTH 5 DAYS Performed at [facility], [address] [place], [place] [address]    Report Status 01/21/2019 FINAL  Final  Blood Culture (routine x 2)     Status: None   Collection Time: 01/16/19 11:25 AM   Specimen: BLOOD  Result Value Ref Range Status   Specimen Description BLOOD RIGHT ANTECUBITAL  Final   Special Requests   Final    BOTTLES DRAWN AEROBIC AND ANAEROBIC Blood Culture adequate volume   Culture   Final    NO GROWTH 5 DAYS Performed at [facility], [place] [address], [place], [place] [address]    Report Status 01/21/2019 FINAL  Final  SARS Coronavirus 2 [facility] order, Performed in [place] hospital lab)     Status: Abnormal   Collection Time: 01/16/19 11:50 AM   Specimen: Nasopharyngeal Swab  Result Value Ref Range Status   SARS Coronavirus 2 POSITIVE (A) NEGATIVE Final    Comment: RESULT CALLED TO, READ BACK BY AND VERIFIED WITH: Benard Halsted RN 13:15 01/16/19 ([email or web address]) (NOTE) If result is NEGATIVE SARS-CoV-2 target nucleic acids are NOT DETECTED. The SARS-CoV-2 RNA is generally detectable in upper and lower  respiratory specimens during the acute phase of infection. The lowest  concentration of SARS-CoV-2 viral copies this assay can detect is 250  copies / mL. A negative result does not preclude SARS-CoV-2 infection  and should not be used as the sole basis for treatment or other  patient management decisions.  A negative result may occur with  improper specimen collection / handling, submission of specimen other  than nasopharyngeal swab, presence of viral mutation(s) within the  areas targeted by this assay, and inadequate  number of viral copies  (<250 copies / mL). A negative result must be  combined with clinical  observations, patient history, and epidemiological information. If result is POSITIVE SARS-CoV-2 target nucleic acids are DETECTED. Th e SARS-CoV-2 RNA is generally detectable in upper and lower  respiratory specimens during the acute phase of infection.  Positive  results are indicative of active infection with SARS-CoV-2.  Clinical  correlation with patient history and other diagnostic information is  necessary to determine patient infection status.  Positive results do  not rule out bacterial infection or co-infection with other viruses. If result is PRESUMPTIVE POSTIVE SARS-CoV-2 nucleic acids MAY BE PRESENT.   A presumptive positive result was obtained on the submitted specimen  and confirmed on repeat testing.  While 2019 novel coronavirus  (SARS-CoV-2) nucleic acids may be present in the submitted sample  additional confirmatory testing may be necessary for epidemiological  and / or clinical management purposes  to differentiate between  SARS-CoV-2 and other Sarbecovirus currently known to infect humans.  If clinically indicated additional testing with an alternate test  methodology [phone]) is  advised. The SARS-CoV-2 RNA is generally  detectable in upper and lower respiratory specimens during the acute  phase of infection. The expected result is Negative. Fact Sheet for Patients:  StrictlyIdeas.no Fact Sheet for Healthcare Providers: BankingDealers.co.za This test is not yet approved or cleared by the [place] FDA and has been authorized for detection and/or diagnosis of SARS-CoV-2 by FDA under an Emergency Use Authorization (EUA).  This EUA will remain in effect (meaning this test can be used) for the duration of the COVID-19 declaration under Section 564(b)(1) of the Act, 21 U.S.C. section 360bbb-3(b)(1), unless the authorization is terminated or revoked sooner. Performed at [facility], [place] [address] [place], [place] [address]   MRSA PCR Screening     Status: None   Collection Time: 01/16/19  6:18 PM   Specimen: Nasal Mucosa; Nasopharyngeal  Result Value Ref Range Status   MRSA by PCR NEGATIVE NEGATIVE Final    Comment:        The GeneXpert MRSA Assay (FDA approved for NASAL specimens only), is one component of a comprehensive MRSA colonization surveillance program. It is not intended to diagnose MRSA infection nor to guide or monitor treatment for MRSA infections. Performed at [facility], [place] [address] [place], [place] [address]      Labs: BNP (last 3 results) Recent Labs    01/16/19 1150  BNP 50.9   Basic Metabolic Panel: Recent Labs  Lab 01/18/19 0230 01/19/19 0315 01/20/19 0413 01/21/19 0355 01/22/19 0440 01/23/19 0526  NA 138 142 137 138 137 137  K 3.9 3.3* 4.0 4.3 4.4 4.5  CL 101 105 100 101 97* 100  CO2 _0 GLUCOSE 154* 59* 189* 170* 172* 142*  BUN 25* 31* 23* 19 22* 23*  CREATININE 0.52* 0.62 0.68 0.59* 0.54* 0.58*  CALCIUM 8.7* 8.8* 8.7* 8.8* 8.8* 8.7*  MG 2.2 2.2 2.1 2.3  --   --    Liver Function Tests: Recent Labs  Lab 01/18/19 0230 01/19/19 0315 01/20/19 0413 01/21/19 0355 01/22/19 0440  AST _1 ALT 54* 46* 55* 60* 58*  ALKPHOS 76 79 75 83 93  BILITOT 0.5 0.3 0.2* 0.6 0.4  PROT 6.9 6.7 6.3* 6.4* 6.4*  ALBUMIN 3.0* 3.2* 3.1* 3.1* 3.4*   No results for input(s): LIPASE, AMYLASE in the last  168 hours. No results for input(s): AMMONIA in the last 168 hours. CBC: Recent Labs  Lab 01/18/19 0230 01/19/19 0315 01/20/19 0413 01/21/19 0355 01/22/19 0440 01/23/19 0526  WBC 8.0 11.9* 8.2 8.9 10.1 12.5*  NEUTROABS 7.0 9.7* 7.3 7.9*  --   --   HGB 14.9 13.8 13.9 15.2 15.6 15.6  HCT 44.5 42.1 42.1 44.6 47.2 46.8  MCV 94.3 94.8 93.6 92.3 92.7 92.1  PLT 501* 599* 556* 552* 611* 627*   Cardiac Enzymes: No results for input(s): CKTOTAL, CKMB, CKMBINDEX, TROPONINI in the  last 168 hours. BNP: Invalid input(s): POCBNP CBG: Recent Labs  Lab 01/23/19 1218 01/23/19 1301 01/23/19 1537 01/23/19 1955 01/24/19 0805  GLUCAP 374* 344* 292* 288* 120*   D-Dimer Recent Labs    01/22/19 0440 01/23/19 0526  DDIMER 0.32 <0.27   Hgb A1c No results for input(s): HGBA1C in the last 72 hours. Lipid Profile No results for input(s): CHOL, HDL, LDLCALC, TRIG, CHOLHDL, LDLDIRECT in the last 72 hours. Thyroid function studies No results for input(s): TSH, T4TOTAL, T3FREE, THYROIDAB in the last 72 hours.  Invalid input(s): FREET3 Anemia work up Recent Labs    01/22/19 0440 01/23/19 0526  FERRITIN 465* 428*   Urinalysis No results found for: COLORURINE, APPEARANCEUR, [place], [place], GLUCOSEU, [place], BILIRUBINUR, [place], PROTEINUR, UROBILINOGEN, NITRITE, LEUKOCYTESUR Sepsis Labs Invalid input(s): PROCALCITONIN,  WBC,  LACTICIDVEN Microbiology Recent Results (from the past 240 hour(s))  Blood Culture (routine x 2)     Status: None   Collection Time: 01/16/19 11:25 AM   Specimen: BLOOD  Result Value Ref Range Status   Specimen Description BLOOD LEFT ANTECUBITAL  Final   Special Requests   Final    BOTTLES DRAWN AEROBIC AND ANAEROBIC Blood Culture adequate volume   Culture   Final    NO GROWTH 5 DAYS Performed at [facility], [address] [place], [place] [address]    Report Status 01/21/2019 FINAL  Final  Blood Culture (routine x 2)     Status: None   Collection Time: 01/16/19 11:25 AM   Specimen: BLOOD  Result Value Ref Range Status   Specimen Description BLOOD RIGHT ANTECUBITAL  Final   Special Requests   Final    BOTTLES DRAWN AEROBIC AND ANAEROBIC Blood Culture adequate volume   Culture   Final    NO GROWTH 5 DAYS Performed at [facility], [place] [address] [place], [place] [address]    Report Status 01/21/2019 FINAL  Final  SARS Coronavirus 2 [facility] order, Performed in [facility] hospital lab)     Status: Abnormal    Collection Time: 01/16/19 11:50 AM   Specimen: Nasopharyngeal Swab  Result Value Ref Range Status   SARS Coronavirus 2 POSITIVE (A) NEGATIVE Final    Comment: RESULT CALLED TO, READ BACK BY AND VERIFIED WITH: Benard Halsted RN 13:15 01/16/19 ([email or web address]) (NOTE) If result is NEGATIVE SARS-CoV-2 target nucleic acids are NOT DETECTED. The SARS-CoV-2 RNA is generally detectable in upper and lower  respiratory specimens during the acute phase of infection. The lowest  concentration of SARS-CoV-2 viral copies this assay can detect is 250  copies / mL. A negative result does not preclude SARS-CoV-2 infection  and should not be used as the sole basis for treatment or other  patient management decisions.  A negative result may occur with  improper specimen collection / handling, submission of specimen other  than nasopharyngeal swab, presence of viral mutation(s) within the  areas targeted by this  assay, and inadequate number of viral copies  (<250 copies / mL). A negative result must be combined with clinical  observations, patient history, and epidemiological information. If result is POSITIVE SARS-CoV-2 target nucleic acids are DETECTED. Th e SARS-CoV-2 RNA is generally detectable in upper and lower  respiratory specimens during the acute phase of infection.  Positive  results are indicative of active infection with SARS-CoV-2.  Clinical  correlation with patient history and other diagnostic information is  necessary to determine patient infection status.  Positive results do  not rule out bacterial infection or co-infection with other viruses. If result is PRESUMPTIVE POSTIVE SARS-CoV-2 nucleic acids MAY BE PRESENT.   A presumptive positive result was obtained on the submitted specimen  and confirmed on repeat testing.  While 2019 novel coronavirus  (SARS-CoV-2) nucleic acids may be present in the submitted sample  additional confirmatory testing may be necessary for epidemiological  and / or  clinical management purposes  to differentiate between  SARS-CoV-2 and other Sarbecovirus currently known to infect humans.  If clinically indicated additional testing with an alternate test  methodology [phone]) is  advised. The SARS-CoV-2 RNA is generally  detectable in upper and lower respiratory specimens during the acute  phase of infection. The expected result is Negative. Fact Sheet for Patients:  StrictlyIdeas.no Fact Sheet for Healthcare Providers: BankingDealers.co.za This test is not yet approved or cleared by the [place] FDA and has been authorized for detection and/or diagnosis of SARS-CoV-2 by FDA under an Emergency Use Authorization (EUA).  This EUA will remain in effect (meaning this test can be used) for the duration of the COVID-19 declaration under Section 564(b)(1) of the Act, 21 U.S.C. section 360bbb-3(b)(1), unless the authorization is terminated or revoked sooner. Performed at [facility], [place] [address], [place], [place] [address]   MRSA PCR Screening     Status: None   Collection Time: 01/16/19  6:18 PM   Specimen: Nasal Mucosa; Nasopharyngeal  Result Value Ref Range Status   MRSA by PCR NEGATIVE NEGATIVE Final    Comment:        The GeneXpert MRSA Assay (FDA approved for NASAL specimens only), is one component of a comprehensive MRSA colonization surveillance program. It is not intended to diagnose MRSA infection nor to guide or monitor treatment for MRSA infections. Performed at [facility], [place] [address], [place], [place] [address]      Time coordinating discharge: 25 minutes     SIGNED:   Edwin Dada, MD  [facility] 01/24/2019, 11:32 AM

## 2019-01-24 NOTE — Progress Notes (Signed)
SATURATION QUALIFICATIONS: (This note is used to comply with regulatory documentation for home oxygen)  Patient Saturations on Room Air at Rest =88%  Patient Saturations on Room Air while Ambulating = 81%  Patient Saturations on 2 Liters of oxygen while Ambulating =93%  Please briefly explain why patient needs home oxygen:  O2 anywhere from 87-93% resting RA. Ambulating on RA drops to 81%. Ambulating 2L 93%. Required 4L when ambulating over 30ft.

## 2019-02-04 ENCOUNTER — Encounter: Payer: Self-pay | Admitting: Nurse Practitioner

## 2019-02-04 ENCOUNTER — Ambulatory Visit: Payer: HRSA Program | Attending: Nurse Practitioner | Admitting: Nurse Practitioner

## 2019-02-04 ENCOUNTER — Other Ambulatory Visit: Payer: Self-pay

## 2019-02-04 DIAGNOSIS — U071 COVID-19: Secondary | ICD-10-CM

## 2019-02-04 DIAGNOSIS — J069 Acute upper respiratory infection, unspecified: Secondary | ICD-10-CM | POA: Diagnosis not present

## 2019-02-04 DIAGNOSIS — E119 Type 2 diabetes mellitus without complications: Secondary | ICD-10-CM

## 2019-02-04 MED ORDER — METFORMIN HCL 1000 MG PO TABS: 1000.0000 mg | ORAL_TABLET | Freq: Two times a day (BID) | ORAL | 3 refills | Status: DC

## 2019-02-04 MED ORDER — TRUE METRIX METER W/DEVICE KIT: PACK | 0 refills | Status: AC

## 2019-02-04 MED ORDER — TRUEPLUS LANCETS 28G MISC: 3 refills | Status: DC

## 2019-02-04 MED ORDER — GLIPIZIDE 5 MG PO TABS: 5.0000 mg | ORAL_TABLET | Freq: Every day | ORAL | 3 refills | Status: DC

## 2019-02-04 MED ORDER — TRUE METRIX BLOOD GLUCOSE TEST VI STRP: ORAL_STRIP | 12 refills | Status: DC

## 2019-02-04 MED FILL — TRUE METRIX TEST STRIP: 25 days supply | Qty: 100 | Fill #0

## 2019-02-04 MED FILL — !TRUE METRIX BLOOD GLUCOSE: 1 days supply | Qty: 1 | Fill #0

## 2019-02-04 MED FILL — TRUEplus LANCETS 28G MISC: 25 days supply | Qty: 100 | Fill #0

## 2019-02-04 NOTE — Progress Notes (Signed)
Virtual Visit via Telephone Note Due to national recommendations of social distancing due to Terry Cole, telehealth visit is felt to be most appropriate for this patient at this time.  I discussed the limitations, risks, security and privacy concerns of performing an evaluation and management service by telephone and the availability of in person appointments. I also discussed with the patient that there may be a patient responsible charge related to this service. The patient expressed understanding and agreed to proceed.    I connected with Terry Cole on 02/04/19  at   2:30 PM EDT  EDT by telephone and verified that I am speaking with the correct person using two identifiers.   Consent I discussed the limitations, risks, security and privacy concerns of performing an evaluation and management service by telephone and the availability of in person appointments. I also discussed with the patient that there may be a patient responsible charge related to this service. The patient expressed understanding and agreed to proceed.   Location of Patient: Private Residence   Location of Provider: [place] and [employer] Office    Persons participating in Telemedicine visit: Geryl Rankins FNP-BC [employer] [employer] [place] [id number]    History of Present Illness: Telemedicine visit for: Establish Care/HFU Newly diagnosed DM He has lived her for 21 years and has never seen PCP.   Admitted to the hospital on 01-16-2019 with COVID Pneumonitis and acute hypoxic respiratory failure.  Was admitted and treated with five days remdesivir 5d, Actemra x2, and convalescent plasma as well as zinc, vitamin C and prophylactic Lovenox.  O2 slowly weaned. He is no longer using O2 as of today. Denies any shortness of breath, chest pain, headaches, GU problems, fever or cough.    DM TYPE 2 Currently only taking metformin 500 mg once daily. Will need to increase to 1000 mg BID.  He was instructed to take 2 pills twice a day until he picks up his new script. He verbalized understanding. Will need to follow up in office for Diabetes education and meter check. I have also increased glipizide to 41m daily. We discussed parameters for blood glucose level monitoring at home as well as dietary modifications.  Lab Results  Component Value Date   HGBA1C 10.7 (H) 01/18/2019    Past Medical History:  Diagnosis Date  . Diabetes mellitus without complication ([facility]     Past Surgical History:  Procedure Laterality Date  . NO PAST SURGERIES      Family History  Problem Relation Age of Onset  . Diabetes Neg Hx   . Hyperlipidemia Neg Hx   . Hypertension Neg Hx     Social History   Socioeconomic History  . Marital status: Single    Spouse name: Not on file  . Number of children: Not on file  . Years of education: Not on file  . Highest education level: Not on file  Occupational History  . Not on file  Social Needs  . Financial resource strain: Not on file  . Food insecurity    Worry: Not on file    Inability: Not on file  . Transportation needs    Medical: Not on file    Non-medical: Not on file  Tobacco Use  . Smoking status: Never Smoker  . Smokeless tobacco: Never Used  Substance and Sexual Activity  . Alcohol use: Yes    Alcohol/week: 1.0 standard drinks    Types: 1 Cans of beer per week  .  Drug use: Not Currently  . Sexual activity: Not Currently  Lifestyle  . Physical activity    Days per week: Not on file    Minutes per session: Not on file  . Stress: Not on file  Relationships  . Social Herbalist on phone: Not on file    Gets together: Not on file    Attends religious service: Not on file    Active member of club or organization: Not on file    Attends meetings of clubs or organizations: Not on file    Relationship status: Not on file  Other Topics Concern  . Not on file  Social History Narrative  . Not on file      Observations/Objective: Awake, alert and oriented x 3   Review of Systems  Constitutional: Negative for fever, malaise/fatigue and weight loss.  HENT: Negative.  Negative for nosebleeds.   Eyes: Negative.  Negative for blurred vision, double vision and photophobia.  Respiratory: Negative.  Negative for cough and shortness of breath.   Cardiovascular: Negative.  Negative for chest pain, palpitations and leg swelling.  Gastrointestinal: Negative.  Negative for heartburn, nausea and vomiting.  Musculoskeletal: Negative.  Negative for myalgias.  Neurological: Negative.  Negative for dizziness, focal weakness, seizures and headaches.  Psychiatric/Behavioral: Negative.  Negative for suicidal ideas.    Assessment and Plan:  Diagnoses and all orders for this visit:  Newly diagnosed diabetes ([place]) -     glipiZIDE (GLUCOTROL) 5 MG tablet; Take 1 tablet (5 mg total) by mouth daily before breakfast. -     metFORMIN (GLUCOPHAGE) 1000 MG tablet; Take 1 tablet (1,000 mg total) by mouth 2 (two) times daily with a meal. -     Blood Glucose Monitoring Suppl (TRUE METRIX METER) w/Device KIT; Use as instructed. Check blood glucose level by fingerstick twice per day. PLEASE MAIL ASAP -     glucose blood (TRUE METRIX BLOOD GLUCOSE TEST) test strip; Use as instructed. Check blood glucose level by fingerstick twice per day. PLEASE MAIL ASAP -     TRUEplus Lancets 28G MISC; Use as instructed. Check blood glucose level by fingerstick twice per day. PLEASE MAIL ASAP -     CBC; Future -     CMP14+EGFR; Future -     Lipid panel; Future -     TSH; Future -     Microalbumin / creatinine urine ratio; Future  Acute respiratory disease due to COVID-Cole virus Symptoms resolved.     Follow Up Instructions Return in about 4 weeks (around 03/04/2019) for DIABETIC TEACHING; METER CHECK.     I discussed the assessment and treatment plan with the patient. The patient was provided an opportunity to ask questions and  all were answered. The patient agreed with the plan and demonstrated an understanding of the instructions.   The patient was advised to call back or seek an in-person evaluation if the symptoms worsen or if the condition fails to improve as anticipated.  I provided 26 minutes of non-face-to-face time during this encounter including median intraservice time, reviewing previous notes, labs, imaging, medications and explaining diagnosis and management.  Gildardo Pounds, FNP-BC

## 2019-02-20 ENCOUNTER — Telehealth: Payer: Self-pay | Admitting: Nurse Practitioner

## 2019-02-20 NOTE — Telephone Encounter (Signed)
Patient called stating that he is no longer using the oxygen tank and would like for someone to come pick them up. States he  is not sure what to do so that he doesnt get charged due to him discontinuing  the oxygen. Please follow up.

## 2019-02-21 NOTE — Telephone Encounter (Signed)
A letter or orders needed from PCP.

## 2019-02-28 NOTE — Telephone Encounter (Signed)
Can we find out who is supplying his machine and a contact number? I am not sure who needs a letter as this has not been an issue before for any patients with home o2

## 2019-03-04 ENCOUNTER — Ambulatory Visit: Payer: Self-pay | Attending: Nurse Practitioner | Admitting: Nurse Practitioner

## 2019-03-04 ENCOUNTER — Encounter: Payer: Self-pay | Admitting: Nurse Practitioner

## 2019-03-04 ENCOUNTER — Other Ambulatory Visit: Payer: Self-pay

## 2019-03-04 VITALS — BP 136/86 | HR 80 | Temp 98.9°F | Ht 68.0 in | Wt 265.0 lb

## 2019-03-04 DIAGNOSIS — E119 Type 2 diabetes mellitus without complications: Secondary | ICD-10-CM

## 2019-03-04 LAB — GLUCOSE, POCT (MANUAL RESULT ENTRY): POC Glucose: 215 mg/dl — AB (ref 70–99)

## 2019-03-04 MED ORDER — GLIPIZIDE 5 MG PO TABS: 5.0000 mg | ORAL_TABLET | Freq: Every day | ORAL | 3 refills | Status: AC

## 2019-03-04 MED ORDER — TRUEPLUS LANCETS 28G MISC: 3 refills | Status: AC

## 2019-03-04 MED ORDER — ATORVASTATIN CALCIUM 20 MG PO TABS: 20.0000 mg | ORAL_TABLET | Freq: Every day | ORAL | 3 refills | Status: AC

## 2019-03-04 MED ORDER — TRUE METRIX BLOOD GLUCOSE TEST VI STRP: ORAL_STRIP | 12 refills | Status: AC

## 2019-03-04 MED ORDER — LISINOPRIL 5 MG PO TABS: 5.0000 mg | ORAL_TABLET | Freq: Every day | ORAL | 3 refills | Status: AC

## 2019-03-04 MED ORDER — METFORMIN HCL 1000 MG PO TABS: 1000.0000 mg | ORAL_TABLET | Freq: Two times a day (BID) | ORAL | 3 refills | Status: AC

## 2019-03-04 MED FILL — glipiZIDE 5 MG TABS: 5 | 30 days supply | Qty: 30 | Fill #0

## 2019-03-04 MED FILL — ATORVASTATIN 20 MG TABLET: 20 | 30 days supply | Qty: 30 | Fill #0

## 2019-03-04 MED FILL — TRUEplus LANCETS 28G MISC: 25 days supply | Qty: 100 | Fill #0

## 2019-03-04 MED FILL — LISINOPRIL 5 MG TAB: 5 | 30 days supply | Qty: 30 | Fill #0

## 2019-03-04 MED FILL — TRUE METRIX TEST STRIP: 25 days supply | Qty: 100 | Fill #0

## 2019-03-04 MED FILL — metFORMIN HCL 1000 MG TABS: 1000 | 30 days supply | Qty: 60 | Fill #0

## 2019-03-04 NOTE — Telephone Encounter (Signed)
Patient called to check on the status of the oxygen tanks states he was told to contact prescribing provider or PCP. Please follow up.

## 2019-03-04 NOTE — Progress Notes (Signed)
Assessment & Plan:  Terry Cole was seen today for follow-up.  Diagnoses and all orders for this visit:  Newly diagnosed diabetes ([place]) -     Glucose (CBG) -     metFORMIN (GLUCOPHAGE) 1000 MG tablet; Take 1 tablet (1,000 mg total) by mouth 2 (two) times daily with a meal. -     glipiZIDE (GLUCOTROL) 5 MG tablet; Take 1 tablet (5 mg total) by mouth daily before breakfast. -     glucose blood (TRUE METRIX BLOOD GLUCOSE TEST) test strip; Use as instructed. Check blood glucose level by fingerstick twice per day. -     TRUEplus Lancets 28G MISC; Use as instructed. Check blood glucose level by fingerstick twice per day. -     atorvastatin (LIPITOR) 20 MG tablet; Take 1 tablet (20 mg total) by mouth daily. -     Microalbumin / creatinine urine ratio -     TSH -     Lipid panel -     CMP14+EGFR -     CBC -     lisinopril (ZESTRIL) 5 MG tablet; Take 1 tablet (5 mg total) by mouth daily. Continue blood sugar control as discussed in office today, low carbohydrate diet, and regular physical exercise as tolerated, 150 minutes per week (30 min each day, 5 days per week, or 50 min 3 days per week). Keep blood sugar logs with fasting goal of 90-130 mg/dl, post prandial (after you eat) less than 180.  For Hypoglycemia: BS <60 and Hyperglycemia BS >400; contact the clinic ASAP. Annual eye exams and foot exams are recommended.   Patient has been counseled on age-appropriate routine health concerns for screening and prevention. These are reviewed and up-to-date. Referrals have been placed accordingly. Immunizations are up-to-date or declined.    Subjective:   Chief Complaint  Patient presents with  . Follow-up    Pt. is here for diabetes follow up. Pt. did not bring his meter today. Pt. is not able to hear well on both side of his ear  since he was little.   HPI Terry Cole 48 y.o. male presents to office today for follow up to new onset DM.  has a past medical history of Diabetes mellitus  without complication ([place]) and Hearing loss.  He has difficulty hearing (bilaterally).  Onset since childhood. Denies any head injury or trauma.    DM TYPE 2 Unfortunately he did not bring his meter for meter check today. He is monitoring his blood glucose levels twice per day. Average fasting 100-110s. Average Postprandial 115-120s. Currently taking glipizide 5 mg daily and metformin 1000 mg BID as prescribed. Denies any hypoglycemic or hyperglycemic symptoms. Overdue for eye exam. Placed on statin and low dose ACE today. Lab Results  Component Value Date   HGBA1C 10.7 (H) 01/18/2019   BP Readings from Last 3 Encounters:  03/04/19 136/86  01/24/19 112/80  12/24/18 133/84   Review of Systems  Constitutional: Negative for fever, malaise/fatigue and weight loss.  HENT: Negative.  Negative for nosebleeds.   Eyes: Negative.  Negative for blurred vision, double vision and photophobia.  Respiratory: Negative.  Negative for cough and shortness of breath.   Cardiovascular: Negative.  Negative for chest pain, palpitations and leg swelling.  Gastrointestinal: Negative.  Negative for heartburn, nausea and vomiting.  Musculoskeletal: Negative.  Negative for myalgias.  Neurological: Negative.  Negative for dizziness, focal weakness, seizures and headaches.  Psychiatric/Behavioral: Negative.  Negative for suicidal ideas.    Past Medical History:  Diagnosis  Date  . Diabetes mellitus without complication ([place])   . Hearing loss     Past Surgical History:  Procedure Laterality Date  . NO PAST SURGERIES      Family History  Problem Relation Age of Onset  . Diabetes Neg Hx   . Hyperlipidemia Neg Hx   . Hypertension Neg Hx     Social History Reviewed with no changes to be made today.   Outpatient Medications Prior to Visit  Medication Sig Dispense Refill  . Blood Glucose Monitoring Suppl (TRUE METRIX METER) w/Device KIT Use as instructed. Check blood glucose level by fingerstick twice  per day. PLEASE MAIL ASAP 1 kit 0  . glipiZIDE (GLUCOTROL) 5 MG tablet Take 1 tablet (5 mg total) by mouth daily before breakfast. 30 tablet 3  . glucose blood (TRUE METRIX BLOOD GLUCOSE TEST) test strip Use as instructed. Check blood glucose level by fingerstick twice per day. PLEASE MAIL ASAP 100 each 12  . metFORMIN (GLUCOPHAGE) 1000 MG tablet Take 1 tablet (1,000 mg total) by mouth 2 (two) times daily with a meal. 60 tablet 3  . TRUEplus Lancets 28G MISC Use as instructed. Check blood glucose level by fingerstick twice per day. PLEASE MAIL ASAP 100 each 3  . blood glucose meter kit and supplies Dispense based on patient and insurance preference. Use up to four times daily as directed. (FOR ICD-10 E10.9, E11.9). (Patient not taking: Reported on 02/04/2019) 1 each 0   No facility-administered medications prior to visit.     No Known Allergies     Objective:    BP 136/86 (BP Location: Left Arm, Patient Position: Sitting, Cuff Size: Large)   Pulse 80   Temp 98.9 F (37.2 C) (Oral)   Ht '5\' 8"'$  (1.727 m)   Wt 265 lb (120.2 kg)   SpO2 98%   BMI 40.29 kg/m  Wt Readings from Last 3 Encounters:  03/04/19 265 lb (120.2 kg)  01/16/19 268 lb 11.9 oz (121.9 kg)    Physical Exam Vitals signs and nursing note reviewed.  Constitutional:      Appearance: He is well-developed.  HENT:     Head: Normocephalic and atraumatic.  Neck:     Musculoskeletal: Normal range of motion.  Cardiovascular:     Rate and Rhythm: Normal rate and regular rhythm.     Heart sounds: Normal heart sounds. No murmur. No friction rub. No gallop.   Pulmonary:     Effort: Pulmonary effort is normal. No tachypnea or respiratory distress.     Breath sounds: Normal breath sounds. No decreased breath sounds, wheezing, rhonchi or rales.  Chest:     Chest wall: No tenderness.  Abdominal:     General: Bowel sounds are normal.     Palpations: Abdomen is soft.  Musculoskeletal: Normal range of motion.  Skin:    General:  Skin is warm and dry.     Findings: Lesion (macular) present. No rash.          Comments: Hyperpigmented macular lesions on bilateral forearms.   Neurological:     Mental Status: He is alert and oriented to person, place, and time.     Coordination: Coordination normal.  Psychiatric:        Behavior: Behavior normal. Behavior is cooperative.        Thought Content: Thought content normal.        Judgment: Judgment normal.       Patient has been counseled extensively about nutrition and exercise as  well as the importance of adherence with medications and regular follow-up. The patient was given clear instructions to go to ER or return to medical center if symptoms don't improve, worsen or new problems develop. The patient verbalized understanding.   Follow-up: Return in about 7 weeks (around 04/22/2019) for [place]; A1c.   Gildardo Pounds, FNP-BC [facility] [place], [place]   03/04/2019, 1:29 PM

## 2019-03-05 LAB — CMP14+EGFR
ALT: 38 IU/L (ref 0–44)
AST: 21 IU/L (ref 0–40)
Albumin/Globulin Ratio: 1.8 (ref 1.2–2.2)
Albumin: 4.3 g/dL (ref 4.0–5.0)
Alkaline Phosphatase: 166 IU/L — ABNORMAL HIGH (ref 39–117)
BUN/Creatinine Ratio: 22 — ABNORMAL HIGH (ref 9–20)
BUN: 15 mg/dL (ref 6–24)
Bilirubin Total: 0.3 mg/dL (ref 0.0–1.2)
CO2: 26 mmol/L (ref 20–29)
Calcium: 9.3 mg/dL (ref 8.7–10.2)
Chloride: 102 mmol/L (ref 96–106)
Creatinine, Ser: 0.67 mg/dL — ABNORMAL LOW (ref 0.76–1.27)
GFR calc Af Amer: 134 mL/min/{1.73_m2} (ref >59)
GFR calc non Af Amer: 116 mL/min/{1.73_m2} (ref >59)
Globulin, Total: 2.4 g/dL (ref 1.5–4.5)
Glucose: 192 mg/dL — ABNORMAL HIGH (ref 65–99)
Potassium: 4.9 mmol/L (ref 3.5–5.2)
Sodium: 139 mmol/L (ref 134–144)
Total Protein: 6.7 g/dL (ref 6.0–8.5)

## 2019-03-05 LAB — CBC
Hematocrit: 44.7 % (ref 37.5–51.0)
Hemoglobin: 15.2 g/dL (ref 13.0–17.7)
MCH: 31.7 pg (ref 26.6–33.0)
MCHC: 34 g/dL (ref 31.5–35.7)
MCV: 93 fL (ref 79–97)
Platelets: 373 10*3/uL (ref 150–450)
RBC: 4.79 x10E6/uL (ref 4.14–5.80)
RDW: 13.3 % (ref 11.6–15.4)
WBC: 6.8 10*3/uL (ref 3.4–10.8)

## 2019-03-05 LAB — LIPID PANEL
Chol/HDL Ratio: 6.2 ratio — ABNORMAL HIGH (ref 0.0–5.0)
Cholesterol, Total: 192 mg/dL (ref 100–199)
HDL: 31 mg/dL — ABNORMAL LOW (ref >39)
LDL Calculated: 107 mg/dL — ABNORMAL HIGH (ref 0–99)
Triglycerides: 268 mg/dL — ABNORMAL HIGH (ref 0–149)
VLDL Cholesterol Cal: 54 mg/dL — ABNORMAL HIGH (ref 5–40)

## 2019-03-05 LAB — MICROALBUMIN / CREATININE URINE RATIO
Creatinine, Urine: 119.4 mg/dL
Microalb/Creat Ratio: 89 mg/g creat — ABNORMAL HIGH (ref 0–29)
Microalbumin, Urine: 105.9 ug/mL

## 2019-03-05 LAB — TSH: TSH: 3.17 u[IU]/mL (ref 0.450–4.500)

## 2019-03-06 ENCOUNTER — Other Ambulatory Visit: Payer: Self-pay | Admitting: Nurse Practitioner

## 2019-03-06 MED ORDER — MISC. DEVICES MISC: 0 refills | Status: AC

## 2019-03-06 NOTE — Telephone Encounter (Signed)
Script in chart.

## 2019-03-06 NOTE — Telephone Encounter (Signed)
SCript in chart

## 2019-03-10 ENCOUNTER — Other Ambulatory Visit: Payer: Self-pay | Admitting: Nurse Practitioner

## 2019-03-12 NOTE — Telephone Encounter (Signed)
CMA had faxed the ordered to [facility].

## 2020-02-23 IMAGING — DX PORTABLE CHEST - 1 VIEW
1 series · 1 of 1 positions shown · non-contrast
Comparison: None.

CLINICAL DATA: Shortness of breath.

EXAM:
PORTABLE CHEST 1 VIEW

[chest]
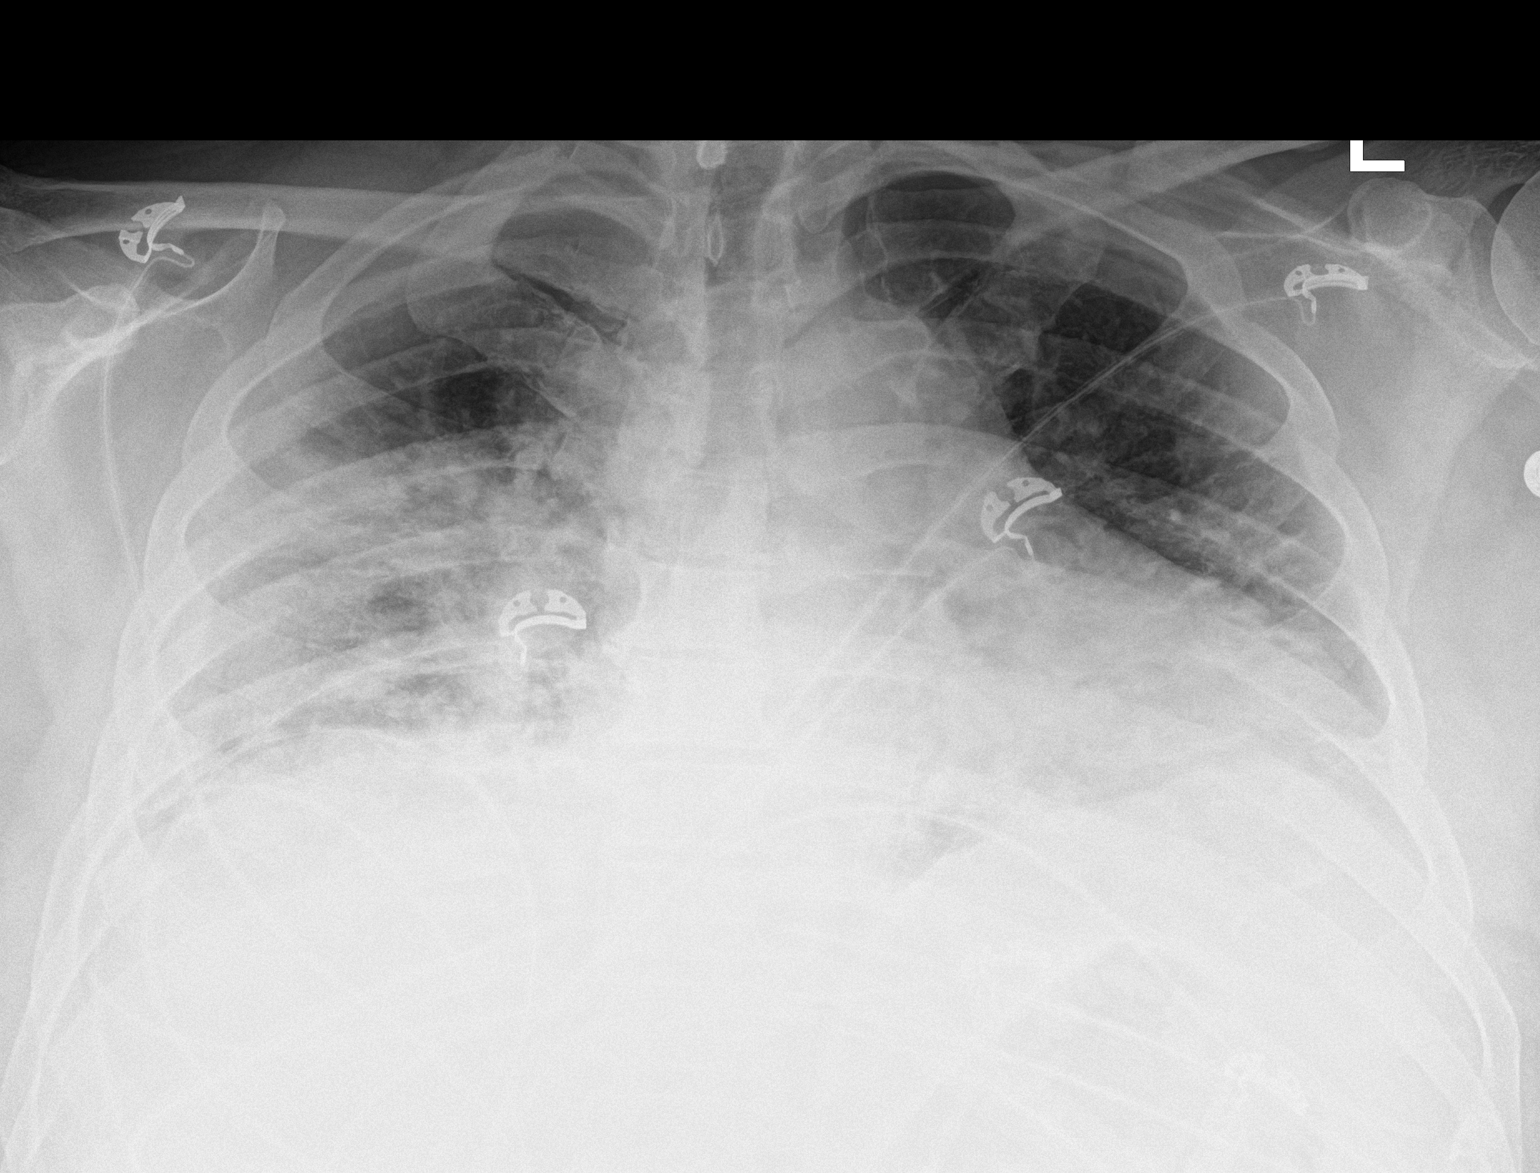

[1 of 1 positions shown; findings below may reference images not displayed]

FINDINGS: Mild cardiomegaly is noted. Hypoinflation of the lungs is noted.
Large right lower lobe opacity is noted most consistent with
pneumonia. Mild left basilar atelectasis or infiltrate is noted. No
pneumothorax is noted. Small pleural effusions cannot be excluded.
Bony thorax is unremarkable.
IMPRESSION: Hypoinflation of the lungs. Large right lower lobe opacity is noted
most consistent with pneumonia. Mild left basilar atelectasis or
infiltrate.
